# Patient Record
Sex: Female | Born: 1945 | Race: Black or African American | Hispanic: No | Marital: Single | Smoking: Never smoker
Health system: Southern US, Community
[De-identification: ages and names within clinical notes are randomized; demographics above are authoritative.]

## PROBLEM LIST (undated history)

## (undated) DIAGNOSIS — E119 Type 2 diabetes mellitus without complications: Secondary | ICD-10-CM

## (undated) DIAGNOSIS — I10 Essential (primary) hypertension: Secondary | ICD-10-CM

## (undated) HISTORY — PX: CHOLECYSTECTOMY: SHX55

---

## 1998-04-27 ENCOUNTER — Other Ambulatory Visit: Admission: RE | Admit: 1998-04-27 | Discharge: 1998-04-27 | Payer: Self-pay | Admitting: Obstetrics and Gynecology

## 1998-04-27 ENCOUNTER — Other Ambulatory Visit: Admission: RE | Admit: 1998-04-27 | Discharge: 1998-04-27 | Payer: Self-pay | Admitting: *Deleted

## 1998-06-29 ENCOUNTER — Ambulatory Visit (HOSPITAL_COMMUNITY): Admission: RE | Admit: 1998-06-29 | Discharge: 1998-06-29 | Payer: Self-pay | Admitting: Gastroenterology

## 2000-04-19 ENCOUNTER — Emergency Department (HOSPITAL_COMMUNITY): Admission: EM | Admit: 2000-04-19 | Discharge: 2000-04-19 | Payer: Self-pay | Admitting: Emergency Medicine

## 2000-05-17 ENCOUNTER — Emergency Department (HOSPITAL_COMMUNITY): Admission: EM | Admit: 2000-05-17 | Discharge: 2000-05-17 | Payer: Self-pay | Admitting: Emergency Medicine

## 2000-08-29 ENCOUNTER — Encounter: Payer: Self-pay | Admitting: Family Medicine

## 2000-08-29 ENCOUNTER — Ambulatory Visit (HOSPITAL_COMMUNITY): Admission: RE | Admit: 2000-08-29 | Discharge: 2000-08-29 | Payer: Self-pay | Admitting: Family Medicine

## 2000-11-04 ENCOUNTER — Other Ambulatory Visit: Admission: RE | Admit: 2000-11-04 | Discharge: 2000-11-04 | Payer: Self-pay | Admitting: *Deleted

## 2000-12-19 ENCOUNTER — Encounter (INDEPENDENT_AMBULATORY_CARE_PROVIDER_SITE_OTHER): Payer: Self-pay | Admitting: *Deleted

## 2000-12-19 ENCOUNTER — Ambulatory Visit (HOSPITAL_COMMUNITY): Admission: RE | Admit: 2000-12-19 | Discharge: 2000-12-19 | Payer: Self-pay | Admitting: *Deleted

## 2001-11-11 ENCOUNTER — Other Ambulatory Visit: Admission: RE | Admit: 2001-11-11 | Discharge: 2001-11-11 | Payer: Self-pay | Admitting: Obstetrics & Gynecology

## 2001-11-18 ENCOUNTER — Ambulatory Visit (HOSPITAL_COMMUNITY): Admission: RE | Admit: 2001-11-18 | Discharge: 2001-11-18 | Payer: Self-pay | Admitting: Family Medicine

## 2001-11-18 ENCOUNTER — Encounter: Payer: Self-pay | Admitting: Family Medicine

## 2002-01-12 ENCOUNTER — Encounter: Admission: RE | Admit: 2002-01-12 | Discharge: 2002-01-12 | Payer: Self-pay | Admitting: Family Medicine

## 2002-01-12 ENCOUNTER — Encounter: Payer: Self-pay | Admitting: Family Medicine

## 2002-11-13 ENCOUNTER — Other Ambulatory Visit: Admission: RE | Admit: 2002-11-13 | Discharge: 2002-11-13 | Payer: Self-pay | Admitting: Obstetrics & Gynecology

## 2002-11-20 ENCOUNTER — Ambulatory Visit (HOSPITAL_COMMUNITY): Admission: RE | Admit: 2002-11-20 | Discharge: 2002-11-20 | Payer: Self-pay | Admitting: Family Medicine

## 2002-11-20 ENCOUNTER — Encounter: Payer: Self-pay | Admitting: Family Medicine

## 2003-09-13 ENCOUNTER — Emergency Department (HOSPITAL_COMMUNITY): Admission: EM | Admit: 2003-09-13 | Discharge: 2003-09-13 | Payer: Self-pay | Admitting: *Deleted

## 2003-11-04 ENCOUNTER — Encounter (INDEPENDENT_AMBULATORY_CARE_PROVIDER_SITE_OTHER): Payer: Self-pay | Admitting: Specialist

## 2003-11-04 ENCOUNTER — Ambulatory Visit (HOSPITAL_COMMUNITY): Admission: RE | Admit: 2003-11-04 | Discharge: 2003-11-05 | Payer: Self-pay | Admitting: General Surgery

## 2003-11-23 ENCOUNTER — Ambulatory Visit (HOSPITAL_COMMUNITY): Admission: RE | Admit: 2003-11-23 | Discharge: 2003-11-23 | Payer: Self-pay | Admitting: Family Medicine

## 2004-12-06 ENCOUNTER — Ambulatory Visit (HOSPITAL_COMMUNITY): Admission: RE | Admit: 2004-12-06 | Discharge: 2004-12-06 | Payer: Self-pay | Admitting: Family Medicine

## 2008-05-17 ENCOUNTER — Emergency Department (HOSPITAL_COMMUNITY): Admission: EM | Admit: 2008-05-17 | Discharge: 2008-05-17 | Payer: Self-pay | Admitting: Emergency Medicine

## 2009-12-13 ENCOUNTER — Encounter: Admission: RE | Admit: 2009-12-13 | Discharge: 2009-12-13 | Payer: Self-pay | Admitting: Family Medicine

## 2010-05-17 LAB — POCT I-STAT, CHEM 8
BUN: 8 mg/dL (ref 6–23)
Calcium, Ion: 1.14 mmol/L (ref 1.12–1.32)
Chloride: 104 mEq/L (ref 96–112)
Creatinine, Ser: 0.7 mg/dL (ref 0.4–1.2)
Glucose, Bld: 125 mg/dL — ABNORMAL HIGH (ref 70–99)
HCT: 44 % (ref 36.0–46.0)
Hemoglobin: 15 g/dL (ref 12.0–15.0)
Potassium: 3.5 mEq/L (ref 3.5–5.1)
Sodium: 139 mEq/L (ref 135–145)
TCO2: 26 mmol/L (ref 0–100)

## 2010-05-17 LAB — URINE MICROSCOPIC-ADD ON

## 2010-05-17 LAB — URINALYSIS, ROUTINE W REFLEX MICROSCOPIC
Bilirubin Urine: NEGATIVE
Glucose, UA: NEGATIVE mg/dL
Ketones, ur: NEGATIVE mg/dL
Leukocytes, UA: NEGATIVE
Nitrite: NEGATIVE
Protein, ur: 100 mg/dL — AB
Specific Gravity, Urine: 1.024 (ref 1.005–1.030)
Urobilinogen, UA: 0.2 mg/dL (ref 0.0–1.0)
pH: 6 (ref 5.0–8.0)

## 2010-05-17 LAB — CBC
HCT: 39.9 % (ref 36.0–46.0)
Hemoglobin: 13.3 g/dL (ref 12.0–15.0)
MCHC: 33.3 g/dL (ref 30.0–36.0)
MCV: 83.9 fL (ref 78.0–100.0)
Platelets: 198 10*3/uL (ref 150–400)
RBC: 4.75 MIL/uL (ref 3.87–5.11)
RDW: 14.6 % (ref 11.5–15.5)
WBC: 5.8 10*3/uL (ref 4.0–10.5)

## 2010-05-17 LAB — DIFFERENTIAL
Basophils Absolute: 0 10*3/uL (ref 0.0–0.1)
Basophils Relative: 0 % (ref 0–1)
Eosinophils Absolute: 0.1 10*3/uL (ref 0.0–0.7)
Eosinophils Relative: 1 % (ref 0–5)
Lymphocytes Relative: 23 % (ref 12–46)
Lymphs Abs: 1.3 10*3/uL (ref 0.7–4.0)
Monocytes Absolute: 0.3 10*3/uL (ref 0.1–1.0)
Monocytes Relative: 6 % (ref 3–12)
Neutro Abs: 4.1 10*3/uL (ref 1.7–7.7)
Neutrophils Relative %: 70 % (ref 43–77)

## 2010-06-23 NOTE — H&P (Signed)
[facility]  Patient:    Terri Mcconnell, Terri Mcconnell Visit Number: [id number] MRN: [id number]          Service Type: Attending:  Sung Amabile. Roslyn Smiling, M.D. Dictated by:   Sung Amabile Roslyn Smiling, M.D. Adm. Date:  12/19/00                           History and Physical  DATE OF BIRTH:                Jun 05, 1961  HISTORY OF PRESENT ILLNESS:   [age]-year-old woman with a history of irregular bleeding.  She is believed to be postmenopausal, but FSH is unavailable.  She has had an IUD in place for more than 30 years, and she is admitted now for removal of IUD and D&C.  Her history is also significant for Trichomonas which was treated in 2001, and again this year.  Recent wet prep is negative.  She has no abdominal pain. She has been counseled regarding the benefits, risks, and options as well as the suspected outcome of this procedure prior to surgery.  PAST MEDICAL HISTORY:         Hypertension.  PAST SURGICAL HISTORY:        None.  OBSTETRICAL HISTORY:          Vaginal deliveries in 1966, 1967, 1968.  ALLERGIES:                    None.  MEDICATIONS:                  1. Toprol XL 50 mg q.d.                               2. Hyzaar 10/25.                               3. Altace 10 mg daily.  FAMILY HISTORY:               Father died of CVA.  Mother with hypertension and cardiovascular disease.  Sister died of breast cancer, age 87.  Brother with adult-onset diabetes mellitus.  SOCIAL HISTORY:               Married.  Works at [employer].  Denies tobacco use.  Drinks two times monthly.  PHYSICAL EXAMINATION:  GENERAL:                      Moderately obese woman.  VITAL SIGNS:                  Recent blood pressure 114/82.  Weight 200.8 pounds.  Vital signs stable.  HEENT:                        Within normal limits.  NECK:                         Without thyromegaly.  CHEST:                        Clear.  HEART:                        Regular rate and  rhythm.  S1, S2, normal.  BREASTS:                      Without mass, discharge, axillary or supraclavicular nodes.  ABDOMEN:                      Soft and nontender.  Without organomegaly, mass, or hernia.  GENITOURINARY:                External genitalia, BUS, vagina without lesion or discharge.  Cervix without lesion.  Uterus retroverted, 10-week size, nontender, mobile.  Adnexa nonpalpable.  RECTOVAGINAL:                 Confirmatory.  EXTREMITIES:                  Without CCE.  NEUROLOGIC:                   Grossly intact.  SKIN:                         Without lesions.  LABORATORY DATA:              Mammogram normal August 29, 2000, at [facility] [place].  Ultrasound November 22, 2000, [facility], shows multiple fibroids with prominent fundal fibroid measuring 8.1 x 4.7 x 4.2. Endometrium measured 0.9 cm.  IUD appears to be in the uterine cavity.  A small simple left ovarian cyst measuring 1.8 x 2.2 x 2.0 was also noted.  Pap smear November 04, 2000, within normal limits except for Trichomonas (treated).  ASSESSMENT:                   1. Postmenopausal bleeding, rule out endometrial                                  pathology.                               2. Retained intrauterine device, recommend                                  removal to allow for further evaluation of                                  endometrium and to eliminate the variable of                                  the intrauterine device with respect to                                  bleeding.                               3. Hypertension, controlled with medication.                               4. Obesity.  5. History of Trichomonas, treated.  PLAN:                         Operative hysteroscopy, D&C, and IUD removal. The patient has been counseled regarding the benefits, risks, and options, as well as the expected outcome of this procedure.  Questions  have been answered preoperatively.  Risks include hemorrhage, infection, uterine perforation with need for laparoscopy to evaluate possibility of intra-abdominal organ damage (bowel, bladder, reproductive organs, blood vessels, nerves, ureters).  The patient has viewed the hysteroscopy videotape and has been given materials regarding this procedure. Dictated by:   Sung Amabile Roslyn Smiling, M.D. Attending:  Sung Amabile. Roslyn Smiling, M.D. DD:  12/15/00 TD:  12/15/00 Job: [id number]

## 2010-06-23 NOTE — Op Note (Signed)
NAME:  Terri Mcconnell, Terri Mcconnell NO.:  [id number]   MEDICAL RECORD NO.:  [id number]          PATIENT TYPE:  OIB   LOCATION:  5733                         FACILITY:  [facility]   PHYSICIAN:  Leonie Man, M.D.   DATE OF BIRTH:  August 12, 1945   DATE OF PROCEDURE:  11/04/2003  DATE OF DISCHARGE:                                 OPERATIVE REPORT   PREOPERATIVE DIAGNOSIS:  Chronic calculous cholecystitis.   POSTOPERATIVE DIAGNOSIS:  Chronic calculous cholecystitis.   PROCEDURE:  Laparoscopic cholecystectomy with intraoperative cholangiogram.   SURGEON:  Leonie Man, M.D.   ASSISTANT:  Anselm Pancoast. Zachery Dakins, M.D.   ANESTHESIA:  General.   The patient is a 65 year old lady referred courtesy of Renaye Rakers, M.D.,  with recurrent attacks of upper abdominal pain associated with nausea and  vomiting.  She had a CT scan of the abdomen, which demonstrates  cholelithiasis.  Liver function studies are within normal limits.   She comes to the operating room now after the risks and potential benefits  of surgery have been fully discussed, all questions answered, and consent  obtained.   PROCEDURE:  Following the induction of satisfactory general anesthesia, the  patient is positioned supinely.  The abdomen is routinely prepped and draped  to be included in the sterile operative field.  Open laparoscopy is created  at the umbilicus with insertion of a Hasson cannula and insufflation of the  peritoneal cavity to 14 mmHg  pressure.  The camera is inserted, visual  inspection of the abdomen carried out.  The liver was smooth, liver edges  sharp.  The gallbladder was chronically scarred and packed with stones.  Anterior gastric wall and duodenal sweep appeared to be normal, and none of  the small or large intestine viewed appeared to be abnormal.   Under direct vision, epigastric and lateral ports were placed.  The  gallbladder was grasped at its fundus and retracted cephalad, dissection  then carried down in the region of the ampulla at the hepatoduodenal  ligament.  I mobilized the cystic artery and cystic duct by tracing the  cystic duct up to its cystic duct-gallbladder junction and the cystic artery  onto its entry into the gallbladder wall.  The cystic artery was triply  clipped and transected.  The cystic duct was clipped proximally and opened.  A cystic duct cholangiogram was then carried out by passing a Cook catheter  into the abdomen and inserting it into the cystic duct.  The duct was then  injected with one-half strength Hypaque dye with the resulting cholangiogram  showing rapid flow of contrast into the duodenum and up into the upper  hepatic radicles with no evidence of any filling defects consistent with  stones.  The cholangiocatheter was removed and the cystic duct was triply  clipped and then transected.  The gallbladder was then dissected free from  the liver bed using electrocautery and maintaining hemostasis throughout the  entire course of the dissection.  At the end of the dissection, the liver  bed was checked for hemostasis and noted to be dry.  All the areas of  dissection were noted to be dry.  The camera was then moved to the  epigastric port and the gallbladder was retrieved through the umbilical port  without difficulty.  The trocars were then removed under direct vision and  pneumoperitoneum allowed to deflate.  Sponge, instrument, and sharp counts  verified and the wounds closed in layers as follows:  The umbilical  wound in two layers with 0 Vicryl and 4-0 Monocryl, epigastric and lateral  flank wounds were closed with 4-0 Monocryl sutures.  All wounds were then  covered with Dermabond.  The anesthetic was reversed, patient removed from  the operating room to the recovery room in stable condition.  She tolerated  the procedure well.      Patr   PB/MEDQ  D:  11/04/2003  T:  11/04/2003  Job:  [id number]   cc:   Renaye Rakers, M.D.  [address]  [address], [address]  [place]  [place] [address]  Fax: [phone]

## 2010-06-23 NOTE — Op Note (Signed)
[facility]  Patient:    Terri Mcconnell, Terri Mcconnell Visit Number: [id number] MRN: [id number]          Service Type: DSU Location: [facility] Attending Physician:  Ardeen Fillers Dictated by:   Sung Amabile. Roslyn Smiling, M.D. Proc. Date: 12/19/00 Admit Date:  12/19/2000                             Operative Report  INDICATIONS:                  This 65 year old woman, postmenopausal by [facility], with a recent history of irregular bleeding.  She has had a Lippes loop in place for more than 30 years, and it is retained.  The string is not visualized.  She is admitted now for the removal of the IUD, and an endometrial evaluation, to rule out endometrial pathology.  PREOPERATIVE DIAGNOSES:       1. Postmenopausal bleeding.                               2. Retained intrauterine device.   POSTOPERATIVE DIAGNOSES:      1. Postmenopausal bleeding.                               2. Retained intrauterine device.  PROCEDURE:                    1. Operative hysteroscopy.                               2. Dilatation and curettage.                               3. Removal of retained intrauterine device.  SURGEON:                      Sung Amabile. Roslyn Smiling, M.D.  ANESTHESIA:                   General anesthesia, endotracheal tube                               intubation, and paracervical block.  ESTIMATED BLOOD LOSS:         100 cc.  TUBES AND DRAINS:             None.  COMPLICATIONS:                None.  FINDINGS;                     A retroverted uterus sounded to 12 cm.  Shaggy endometrium near the fundus, excised.  Retained, not embedded, IUD removed.  SPECIMENS:                    Hysteroscopic endometrial biopsies and endometrial curettings to pathology.  DESCRIPTION OF PROCEDURE:     After the establishment of general anesthesia, the patient was placed in the dorsal lithotomy position.  The perineum and vagina were prepped and the bladder was evacuated with  straight catheterization.  The patient was draped.  Examination under anesthesia for the above  findings was carried out.  A Graves speculum was inserted in the vagina.  The cervix was reprepped with Betadine solution.  The anterior cervical lip was infiltrated with 1% Nesacaine and then grasped with the single tooth tenaculum.  A paracervical block was placed in the usual fashion using 20 cc of 1% Nesacaine.  The uterus was noted to be retroverted and sounded to 12 cm.  Pratt dilators were used to serially dilate the endocervical canal to a 35-French.  The operative hysteroscope was introduced into the endometrial cavity easily.  The IUD was seen and photographs were taken.  The scope was removed and the IUD was retrieved using small ring forceps.  The scope was reinserted and further photographs were taken.  Shaggy areas near the fundus were biopsied.  The scope was removed for the last time, and a sharp curettage was performed.  The instruments were removed and  hemostasis was noted.  The patient was returned to the supine position, extubated without difficulty, and transported to the recovery room in satisfactory condition.  Sorbitol was the distending medium during this case.  There is a deficit of 10 cc at the end of the case.  A single loop 90-degree resectoscopic loop was used with settings of 90 and 60, cutting and coagulation respectively. Dictated by:   Sung Amabile Roslyn Smiling, M.D. Attending Physician:  Ardeen Fillers DD:  12/19/00 TD:  12/19/00 Job: [id number]

## 2010-10-22 ENCOUNTER — Inpatient Hospital Stay (HOSPITAL_COMMUNITY)
Admission: EM | Admit: 2010-10-22 | Discharge: 2010-10-23 | DRG: 312 | Disposition: A | Discharge status: Home / Self Care | Payer: PRIVATE HEALTH INSURANCE | Attending: Family Medicine | Admitting: Family Medicine

## 2010-10-22 ENCOUNTER — Emergency Department (HOSPITAL_COMMUNITY): Payer: PRIVATE HEALTH INSURANCE

## 2010-10-22 DIAGNOSIS — I951 Orthostatic hypotension: Principal | ICD-10-CM | POA: Diagnosis present

## 2010-10-22 DIAGNOSIS — Y92009 Unspecified place in unspecified non-institutional (private) residence as the place of occurrence of the external cause: Secondary | ICD-10-CM

## 2010-10-22 DIAGNOSIS — I6529 Occlusion and stenosis of unspecified carotid artery: Secondary | ICD-10-CM | POA: Diagnosis present

## 2010-10-22 DIAGNOSIS — E7800 Pure hypercholesterolemia, unspecified: Secondary | ICD-10-CM | POA: Diagnosis present

## 2010-10-22 DIAGNOSIS — Z7982 Long term (current) use of aspirin: Secondary | ICD-10-CM

## 2010-10-22 DIAGNOSIS — E876 Hypokalemia: Secondary | ICD-10-CM | POA: Diagnosis present

## 2010-10-22 DIAGNOSIS — Z79899 Other long term (current) drug therapy: Secondary | ICD-10-CM

## 2010-10-22 DIAGNOSIS — N179 Acute kidney failure, unspecified: Secondary | ICD-10-CM | POA: Diagnosis present

## 2010-10-22 DIAGNOSIS — F329 Major depressive disorder, single episode, unspecified: Secondary | ICD-10-CM | POA: Diagnosis present

## 2010-10-22 DIAGNOSIS — T502X5A Adverse effect of carbonic-anhydrase inhibitors, benzothiadiazides and other diuretics, initial encounter: Secondary | ICD-10-CM | POA: Diagnosis present

## 2010-10-22 DIAGNOSIS — E785 Hyperlipidemia, unspecified: Secondary | ICD-10-CM | POA: Diagnosis present

## 2010-10-22 DIAGNOSIS — J309 Allergic rhinitis, unspecified: Secondary | ICD-10-CM | POA: Diagnosis present

## 2010-10-22 DIAGNOSIS — E119 Type 2 diabetes mellitus without complications: Secondary | ICD-10-CM | POA: Diagnosis present

## 2010-10-22 DIAGNOSIS — I10 Essential (primary) hypertension: Secondary | ICD-10-CM | POA: Diagnosis present

## 2010-10-22 DIAGNOSIS — F3289 Other specified depressive episodes: Secondary | ICD-10-CM | POA: Diagnosis present

## 2010-10-22 LAB — URINE MICROSCOPIC-ADD ON

## 2010-10-22 LAB — URINALYSIS, ROUTINE W REFLEX MICROSCOPIC
Bilirubin Urine: NEGATIVE
Glucose, UA: NEGATIVE mg/dL
Ketones, ur: NEGATIVE mg/dL
Nitrite: NEGATIVE
Protein, ur: NEGATIVE mg/dL
Specific Gravity, Urine: 1.017 (ref 1.005–1.030)
Urobilinogen, UA: 0.2 mg/dL (ref 0.0–1.0)
pH: 5 (ref 5.0–8.0)

## 2010-10-22 LAB — POCT I-STAT, CHEM 8
BUN: 26 mg/dL — ABNORMAL HIGH (ref 6–23)
Calcium, Ion: 1.19 mmol/L (ref 1.12–1.32)
Chloride: 103 mEq/L (ref 96–112)
Creatinine, Ser: 1.4 mg/dL — ABNORMAL HIGH (ref 0.50–1.10)
Glucose, Bld: 135 mg/dL — ABNORMAL HIGH (ref 70–99)
HCT: 42 % (ref 36.0–46.0)
Hemoglobin: 14.3 g/dL (ref 12.0–15.0)
Potassium: 2.9 mEq/L — ABNORMAL LOW (ref 3.5–5.1)
Sodium: 139 mEq/L (ref 135–145)
TCO2: 21 mmol/L (ref 0–100)

## 2010-10-22 LAB — CBC
HCT: 35.7 % — ABNORMAL LOW (ref 36.0–46.0)
Hemoglobin: 11.9 g/dL — ABNORMAL LOW (ref 12.0–15.0)
MCH: 27.7 pg (ref 26.0–34.0)
MCHC: 33.3 g/dL (ref 30.0–36.0)
MCV: 83.2 fL (ref 78.0–100.0)
Platelets: 208 10*3/uL (ref 150–400)
RBC: 4.29 MIL/uL (ref 3.87–5.11)
RDW: 14.9 % (ref 11.5–15.5)
WBC: 6.1 10*3/uL (ref 4.0–10.5)

## 2010-10-22 LAB — GLUCOSE, CAPILLARY: Glucose-Capillary: 150 mg/dL — ABNORMAL HIGH (ref 70–99)

## 2010-10-22 LAB — POCT I-STAT TROPONIN I: Troponin i, poc: 0 ng/mL (ref 0.00–0.08)

## 2010-10-23 DIAGNOSIS — R55 Syncope and collapse: Secondary | ICD-10-CM

## 2010-10-23 LAB — CARDIAC PANEL(CRET KIN+CKTOT+MB+TROPI)
CK, MB: 3.6 ng/mL (ref 0.3–4.0)
CK, MB: 3.7 ng/mL (ref 0.3–4.0)
CK, MB: 3.2 ng/mL (ref 0.3–4.0)
Relative Index: 1.7 (ref 0.0–2.5)
Relative Index: 1.5 (ref 0.0–2.5)
Relative Index: 1.3 (ref 0.0–2.5)
Total CK: 209 U/L — ABNORMAL HIGH (ref 7–177)
Total CK: 246 U/L — ABNORMAL HIGH (ref 7–177)
Total CK: 243 U/L — ABNORMAL HIGH (ref 7–177)
Troponin I: <0.3 ng/mL (ref <0.30)
Troponin I: <0.3 ng/mL (ref <0.30)
Troponin I: <0.3 ng/mL (ref <0.30)

## 2010-10-23 LAB — CBC
HCT: 30.2 % — ABNORMAL LOW (ref 36.0–46.0)
Hemoglobin: 9.9 g/dL — ABNORMAL LOW (ref 12.0–15.0)
MCH: 27 pg (ref 26.0–34.0)
MCHC: 32.8 g/dL (ref 30.0–36.0)
MCV: 82.3 fL (ref 78.0–100.0)
Platelets: 200 10*3/uL (ref 150–400)
RBC: 3.67 MIL/uL — ABNORMAL LOW (ref 3.87–5.11)
RDW: 15 % (ref 11.5–15.5)
WBC: 5.8 10*3/uL (ref 4.0–10.5)

## 2010-10-23 LAB — COMPREHENSIVE METABOLIC PANEL
ALT: 17 U/L (ref 0–35)
AST: 19 U/L (ref 0–37)
Albumin: 3.5 g/dL (ref 3.5–5.2)
Alkaline Phosphatase: 69 U/L (ref 39–117)
BUN: 24 mg/dL — ABNORMAL HIGH (ref 6–23)
CO2: 23 mEq/L (ref 19–32)
Calcium: 8.5 mg/dL (ref 8.4–10.5)
Chloride: 102 mEq/L (ref 96–112)
Creatinine, Ser: 0.75 mg/dL (ref 0.50–1.10)
GFR calc Af Amer: >60 mL/min (ref >60)
GFR calc non Af Amer: >60 mL/min (ref >60)
Glucose, Bld: 99 mg/dL (ref 70–99)
Potassium: 3.6 mEq/L (ref 3.5–5.1)
Sodium: 138 mEq/L (ref 135–145)
Total Bilirubin: 0.2 mg/dL — ABNORMAL LOW (ref 0.3–1.2)
Total Protein: 6.7 g/dL (ref 6.0–8.3)

## 2010-10-23 LAB — CK TOTAL AND CKMB (NOT AT ARMC)
CK, MB: 4 ng/mL (ref 0.3–4.0)
Relative Index: 1.8 (ref 0.0–2.5)
Total CK: 223 U/L — ABNORMAL HIGH (ref 7–177)

## 2010-10-23 LAB — GLUCOSE, CAPILLARY
Glucose-Capillary: 110 mg/dL — ABNORMAL HIGH (ref 70–99)
Glucose-Capillary: 139 mg/dL — ABNORMAL HIGH (ref 70–99)
Glucose-Capillary: 114 mg/dL — ABNORMAL HIGH (ref 70–99)

## 2010-10-23 LAB — TSH: TSH: 2.048 u[IU]/mL (ref 0.350–4.500)

## 2010-10-23 LAB — LIPID PANEL
Cholesterol: 151 mg/dL (ref 0–200)
HDL: 71 mg/dL (ref >39)
LDL Cholesterol: 58 mg/dL (ref 0–99)
Total CHOL/HDL Ratio: 2.1 RATIO
Triglycerides: 110 mg/dL (ref <150)
VLDL: 22 mg/dL (ref 0–40)

## 2010-10-23 LAB — PRO B NATRIURETIC PEPTIDE: Pro B Natriuretic peptide (BNP): 15.2 pg/mL (ref 0–125)

## 2010-10-23 LAB — PHOSPHORUS: Phosphorus: 4.6 mg/dL (ref 2.3–4.6)

## 2010-10-23 LAB — TROPONIN I: Troponin I: <0.3 ng/mL (ref <0.30)

## 2010-10-23 LAB — HEMOGLOBIN A1C
Hgb A1c MFr Bld: 6.3 % — ABNORMAL HIGH (ref <5.7)
Mean Plasma Glucose: 134 mg/dL — ABNORMAL HIGH (ref <117)

## 2010-10-23 LAB — MAGNESIUM: Magnesium: 1.9 mg/dL (ref 1.5–2.5)

## 2010-10-26 ENCOUNTER — Encounter (INDEPENDENT_AMBULATORY_CARE_PROVIDER_SITE_OTHER): Payer: PRIVATE HEALTH INSURANCE

## 2010-10-26 DIAGNOSIS — R55 Syncope and collapse: Secondary | ICD-10-CM

## 2010-11-14 NOTE — Discharge Summary (Signed)
Terri Mcconnell, Terri Mcconnell NO.:  [id number]  MEDICAL RECORD NO.:  [id number]  LOCATION:  4735                         FACILITY:  [facility]  PHYSICIAN:  Mauro Kaufmann, MD         DATE OF BIRTH:  May 11, 1945  DATE OF ADMISSION:  10/22/2010 DATE OF DISCHARGE:  10/23/2010                              DISCHARGE SUMMARY   ADMISSION DIAGNOSES: 1. Syncope. 2. Hypokalemia. 3. Orthostatic hypotension. 4. Diabetes mellitus.  DISCHARGE DIAGNOSES: 1. Syncope, resolved. 2. Orthostatic hypotension due to the hydrochlorothiazide. 3. Hypokalemia, resolved secondary to diuretic. 4. Orthostatic hypotension, resolved. 5. Diabetes mellitus.  TESTS PERFORMED IN THE HOSPITAL: 1. Chest x-ray on October 22, 2010, showed no acute disease.  CT of     the head without contrast showed no evidence of acute abnormality.     The patient had a 2-D echocardiogram done on October 23, 2010     which showed EF of 55%-65% grade 2 diastolic dysfunction. 2. Carotid artery Duplex scan showed right 40%-59% ICA stenosis mid     high range of the scale.  BRIEF HISTORY AND PHYSICAL:  A 65 year old female with history of diabetes, hyperlipidemia doing fine, actually she had just finished cooking dinner, went to bathroom and passed out and again she only passed out for 30 seconds and came to bed and passed out again.  The patient was found in the ER to be orthostatic with the blood pressure dropping from 190 to 99.  Her diuretics were held, she also found to have hypokalemia which was also considered secondary to diuretic use. She was found to be in acute renal failure due to the hydrochlorothiazide use.  She was started on IV fluids.  BRIEF HOSPITAL COURSE: 1. Syncope most likely thought to be secondary to orthostatic     hypotension, has been resolved.  Renal function has improved.  BUN     of 24 and creatinine 0.75.  The patient's blood pressure has also     improved.  At this time, her blood  pressure is 137/76, respirations     16, pulse 100, and orthostatics have been negative now.  The     patient's EKG shows first degree AV block and some ST-T changes.     Cardiac enzymes x3 negative.  We are going to get Cardiology to set     up a Holter monitor for the patient as an outpatient.  Also the     patient had bilateral carotid artery Duplex which shows right ICA     stenosis of 40%-69%.  At this time, the patient is tipped for     surgery.  I discussed with Neurology.  The patient will be sent     home on aspirin for primary prevention of stroke because the     patient is at high risk for stroke, including hypertension and     diabetes mellitus. 2. Hypertension.  The patient will be sent home on Exforge.  I am     going to stop the hydrochlorothiazide.  So, she will be only on     amlodipine and losartan, I am going to cut down the losartan from  320 to 160 mg.  So, the patient will take this dose of medication     and will check with the primary care physician Dr. Renaye Rakers in 1     week.  MEDICATIONS ON DISCHARGE: 1. Aspirin 81 mg p.o. daily. 2. Exforge 10/160 one tablet p.o. daily. 3. Cod liver oil one capsule p.o. daily. 4. Crestor 10 mg p.o. daily. 5. Metformin 500 mg p.o. daily at bedtime.  Again, we will set up the Holter monitor as an outpatient.  The patient is totally asymptomatic at this time.     Mauro Kaufmann, MD     GL/MEDQ  D:  10/23/2010  T:  10/23/2010  Job:  [id number]  cc:   Renaye Rakers, M.D.  Electronically Signed by Mauro Kaufmann  on 11/14/2010 09:34:05 AM

## 2010-11-18 NOTE — H&P (Signed)
NAMEMarland Mcconnell  [name], RUBERG NO.:  [id number]  MEDICAL RECORD NO.:  [id number]  LOCATION:  [facility]                         FACILITY:  [facility]  PHYSICIAN:  Lonia Blood, M.D.      DATE OF BIRTH:  [birth year]  DATE OF ADMISSION:  10/22/2010 DATE OF DISCHARGE:                             HISTORY & PHYSICAL   PRIMARY CARE PHYSICIAN:  Renaye Rakers, MD  PRESENTING COMPLAINT:  Syncope.  HISTORY OF PRESENT ILLNESS:  The patient is 65 year old female with history of diabetes and hyperlipidemia who was doing fine, actually had just finished cooking dinner, went to the bathroom, and passed out.  She apparently passed out for about 30 seconds, came to get up and passed out again and according to EMS, was found in the bathtub.  She has never had any prior syncopal episodes.  No history of seizure disorder.  She did not hit her head anywhere.  Denied any palpitations.  No chest pain. No shortness of breath, but she felt diaphoretic.  In the ED, the patient was also found to be orthostatic somewhat.  She continues to have nausea and vomiting after the episode including en route to the ED according to EMS.  All this started after her initial symptoms.  No fever or chills.  No recent cold, nausea, vomiting, or diarrhea.  PAST MEDICAL HISTORY:  Significant for: 1. Diabetes. 2. Hypertension. 3. Hyperlipidemia. 4. Allergic rhinitis. 5. Depression.  ALLERGIES:  NO KNOWN DRUG ALLERGIES.  MEDICATIONS:  Crestor, Exforge HCT, metformin, and Paxil.  SOCIAL HISTORY:  She is married, lives in [place] with her husband. No tobacco, occasional alcohol.  No IV drug history.  FAMILY HISTORY:  Denied any family history of heart disease or sudden death.  REVIEW OF SYSTEMS:  All systems reviewed and are negative except per HPI.  PHYSICAL EXAMINATION:  VITAL SIGNS:  Temperature is 97.5.  Initial blood pressure 110/63 with a pulse of 94, respiratory rate of 22, her sats 97% on room air.  The  patient was orthostatic from lying-to-standing with her pulse going from 73-98, blood pressure dropping from 119 to 99, that is systolic. GENERAL:  She is currently awake, alert, oriented, sitting in the chair, no dizziness, in no acute distress. HEENT:  PERRL.  EOMI.  No significant pallor, no jaundice.  No rhinorrhea. NECK:  Supple.  No visible JVD, no lymphadenopathy. RESPIRATORY:  She has good air entry bilaterally.  No wheezes, no rales, no crackles. CARDIOVASCULAR:  The patient has S1-S2.  No audible murmur. ABDOMEN: Soft, full, nontender with positive bowel sounds. EXTREMITIES:  No edema, cyanosis, or clubbing. SKIN:  No rashes.  No ulcers.  LABORATORY DATA:  Sodium 139, potassium 2.9, chloride 103, BUN 26, creatinine 1.40, glucose 135, calcium 1.19.  Troponin is negative. White count 6.1, hemoglobin 11.9 with platelet count 208.  Her chest x- ray showed no active disease.  Head CT without contrast showed no acute intracranial abnormality.  Her EKG showed normal sinus rhythm with prolonged PR interval of 208 indicating first-degree block and poor R- wave progression, but no significant ST changes.  EKG is not significantly changed from her prior EKG of November 03, 2003.  ASSESSMENT:  This is a 65 year old female admitted with syncopal episode, more than likely secondary to dehydration.  She is taking diuretic and per patient her last meal was this morning.  She has some mild renal insufficiency, but it is not clear if this is acute or chronic.  We have no old creatinine right now on record to check.  The patient also has had a syncopal episode because of transient arrhythmias, although she denied any palpitations, or similar symptoms. CVA is less likely.  She has no focal weakness.  Her neuro exam is completely normal.  PLAN: 1. Syncope.  Admit the patient, hydrate the patient, check     orthostatics in the morning.  I will check 2-D echo and carotid     Dopplers.  I  will defer MRI, MRA, I do not think the patient will     need that now unless further abnormalities are found.  I will hold     her diuretics for now. 2. Hypokalemia.  Probably from her nausea and vomiting.  I will     replete the potassium and check an magnesium level.  3.  Nausea and     vomiting.  More than likely from associated with her syncopal     episode.  She is now feeling GERD like symptoms with epigastric     discomfort.  I will put her on PPIs. 3. Orthostatic hypotension.  Again more than likely is related to     diuretic use. 4. Diabetes.  I will put her on sliding scale insulin and hold the     metformin while in the hospital.  I will check a hemoglobin A1c as     well.  Further treatment will depend on the patient's response to these measures.     Lonia Blood, M.D.     Verlin Grills  D:  10/22/2010  T:  10/22/2010  Job:  [id number]  Electronically Signed by Lonia Blood M.D. on 11/18/2010 03:13:57 PM

## 2012-07-09 ENCOUNTER — Other Ambulatory Visit (HOSPITAL_COMMUNITY): Payer: Self-pay | Admitting: Family Medicine

## 2012-07-09 DIAGNOSIS — Z1231 Encounter for screening mammogram for malignant neoplasm of breast: Secondary | ICD-10-CM

## 2012-07-14 ENCOUNTER — Ambulatory Visit (HOSPITAL_COMMUNITY)
Admission: RE | Admit: 2012-07-14 | Discharge: 2012-07-14 | Disposition: A | Discharge status: Home / Self Care | Payer: Medicare PPO | Source: Ambulatory Visit | Attending: Family Medicine | Admitting: Family Medicine

## 2012-07-14 DIAGNOSIS — Z1231 Encounter for screening mammogram for malignant neoplasm of breast: Secondary | ICD-10-CM | POA: Insufficient documentation

## 2012-11-27 IMAGING — CT CT HEAD W/O CM
1 of 2 series · 13 of 30 positions shown, 17 images · non-contrast
Comparison: 05/17/2008

CLINICAL DATA: Syncope.

CT HEAD WITHOUT CONTRAST
TECHNIQUE: Contiguous axial images were obtained from the base of
the skull through the vertex without contrast.

[Series 2: brain · axial · 0.47mm/px · z∈[+86,+208]mm · 13 of 28 slices shown, 17 images]
[im 2/28  brain]
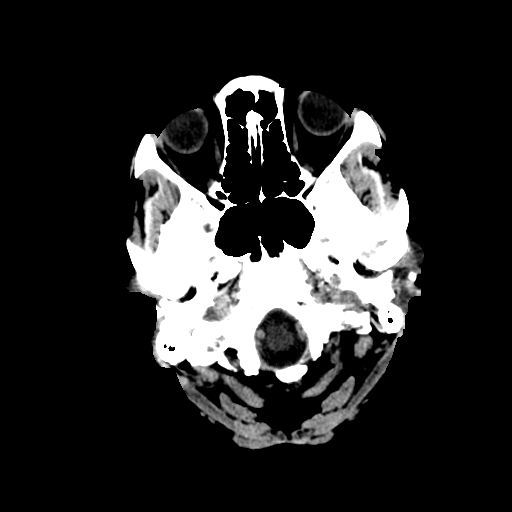
[im 2/28  bone]
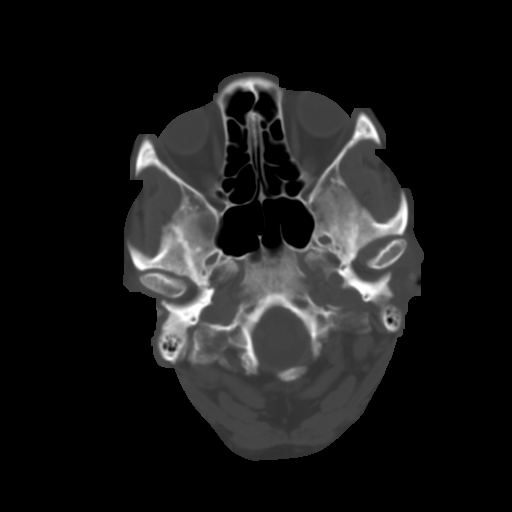
[im 4/28  brain]
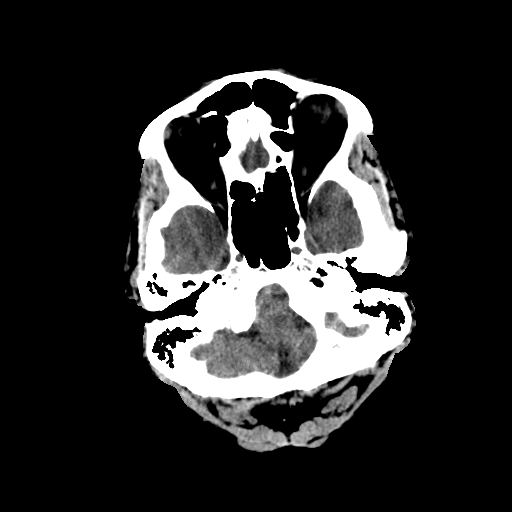
[im 6/28  brain]
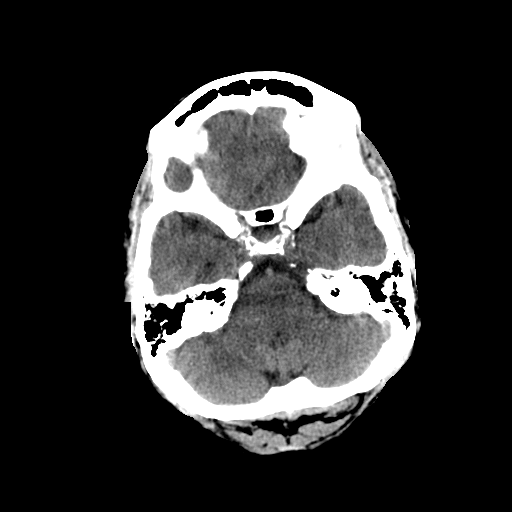
[im 8/28  brain]
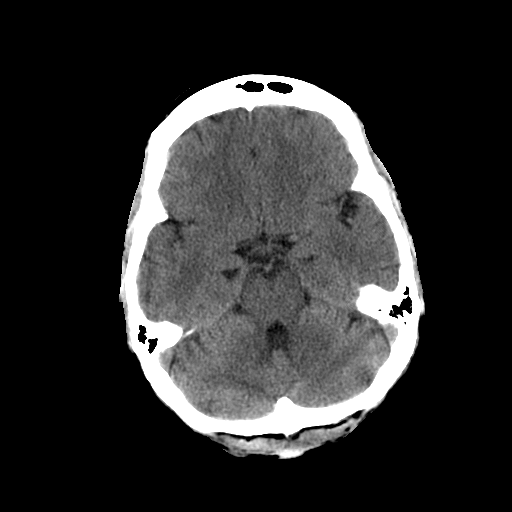
[im 10/28  brain]
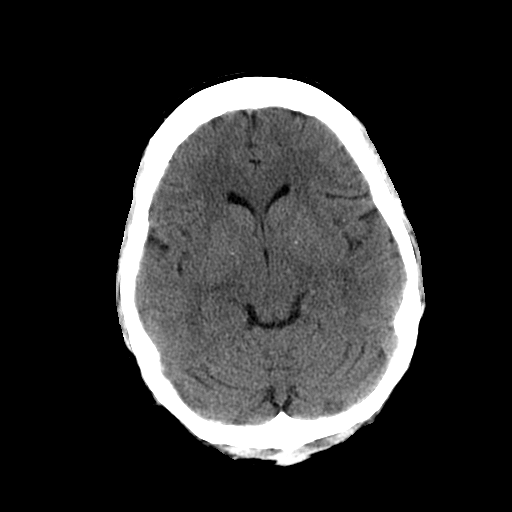
[im 10/28  bone]
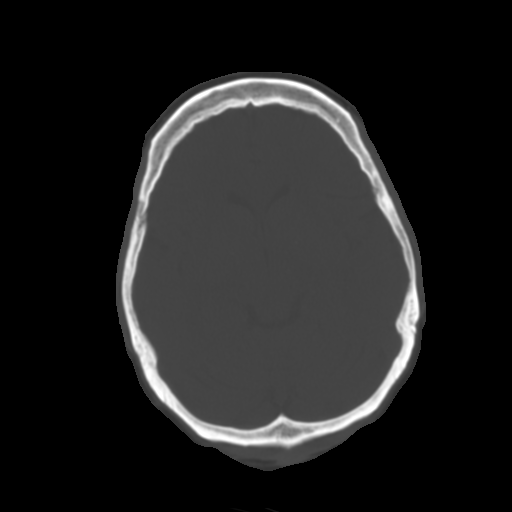
[im 12/28  brain]
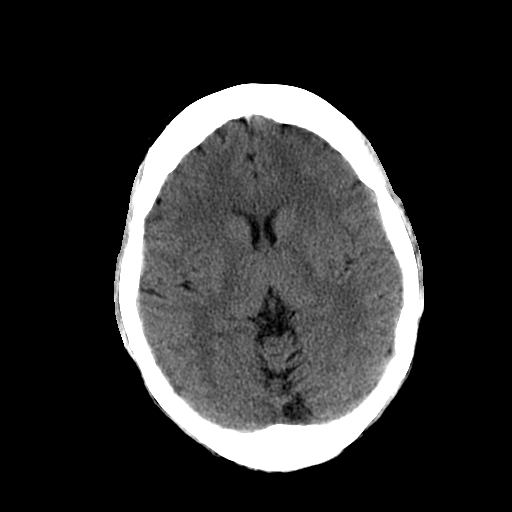
[im 14/28  brain]
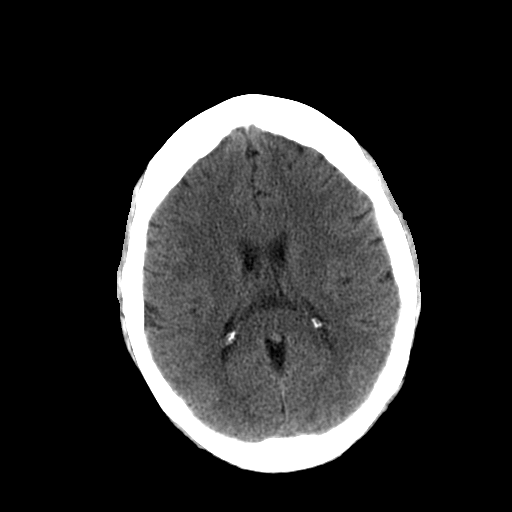
[im 16/28  brain]
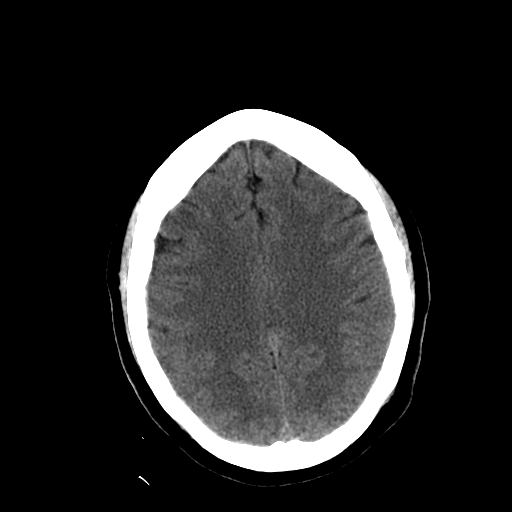
[im 18/28  brain]
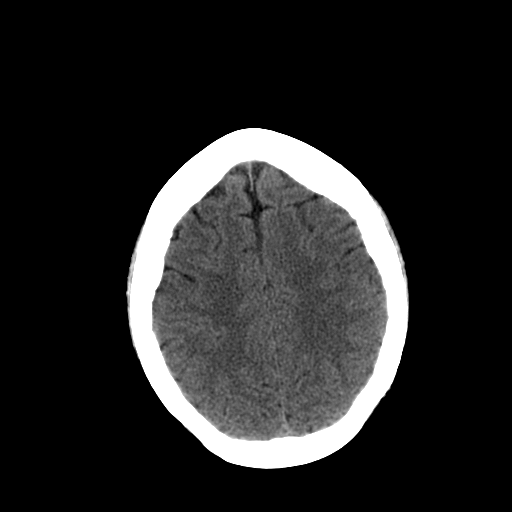
[im 18/28  bone]
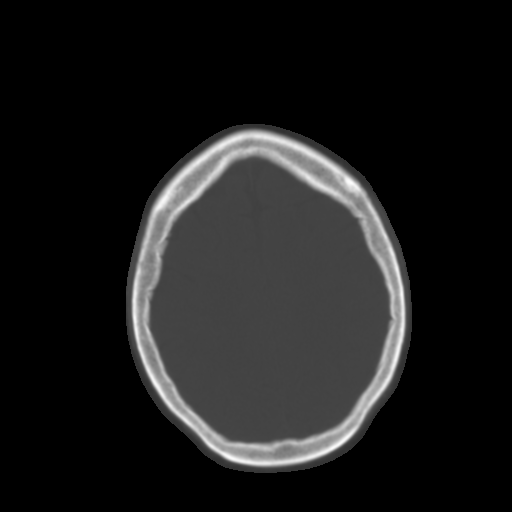
[im 20/28  brain]
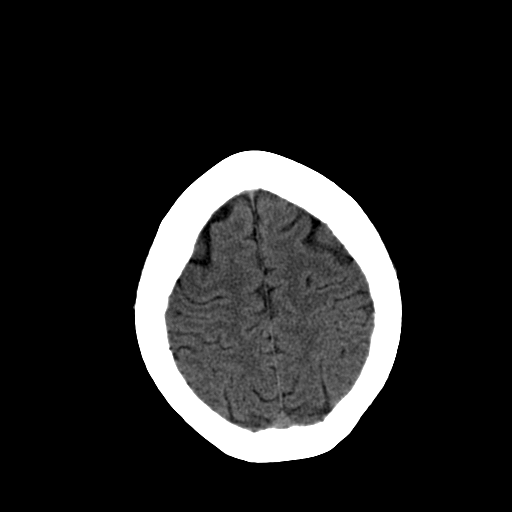
[im 22/28  brain]
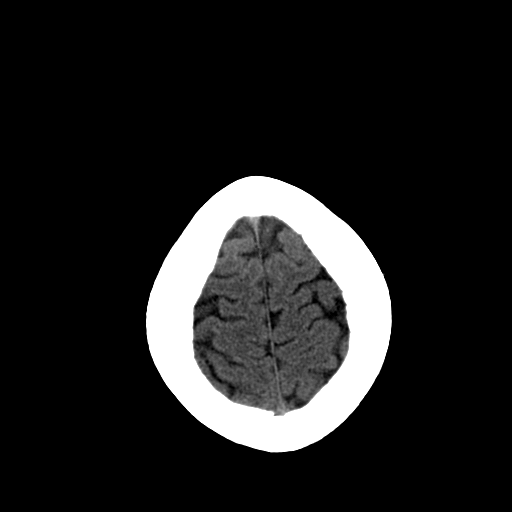
[im 24/28  brain]
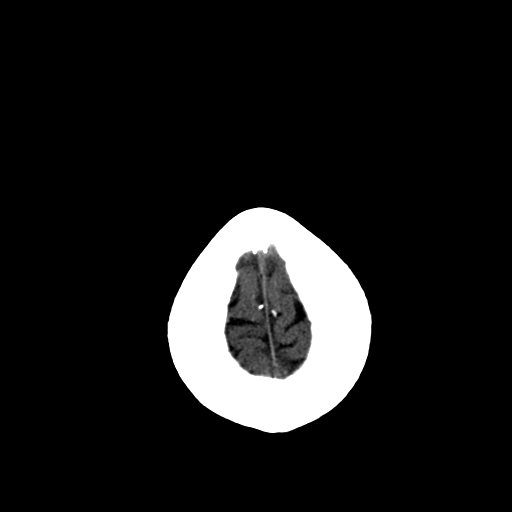
[im 26/28  brain]
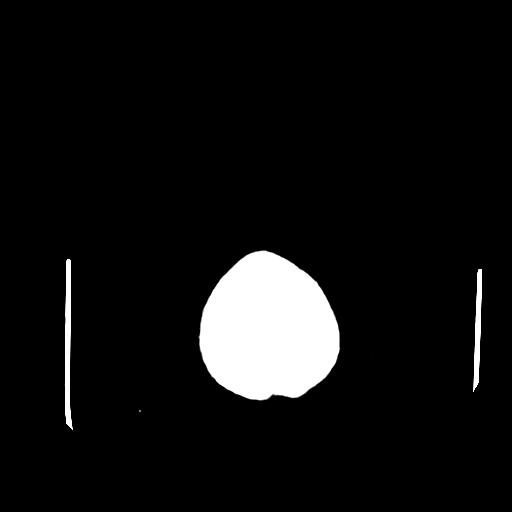
[im 26/28  bone]
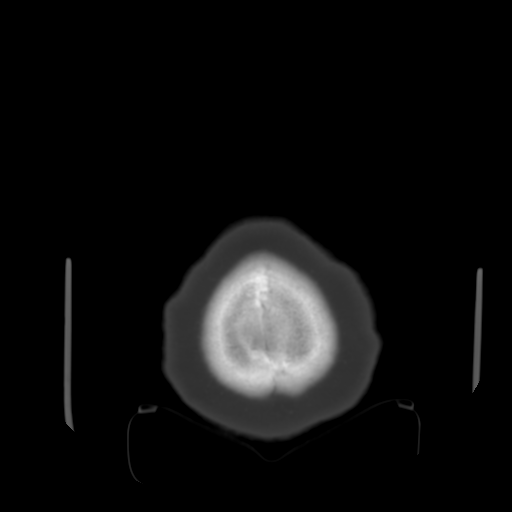

[13 of 30 positions shown; findings below may reference images not displayed]

FINDINGS: No acute intracranial abnormalities are identified,
including mass lesion or mass effect, hydrocephalus, extra-axial
fluid collection, midline shift, hemorrhage, or acute infarction.

The visualized bony calvarium is unremarkable.
IMPRESSION: No evidence of acute intracranial abnormality.

## 2012-11-27 IMAGING — CR DG CHEST 2V
1 series · 1 of 1 positions shown · non-contrast
Comparison: 11/03/2003.

CLINICAL DATA: Syncope.  Weakness.

CHEST - 2 VIEW

[view not recorded]
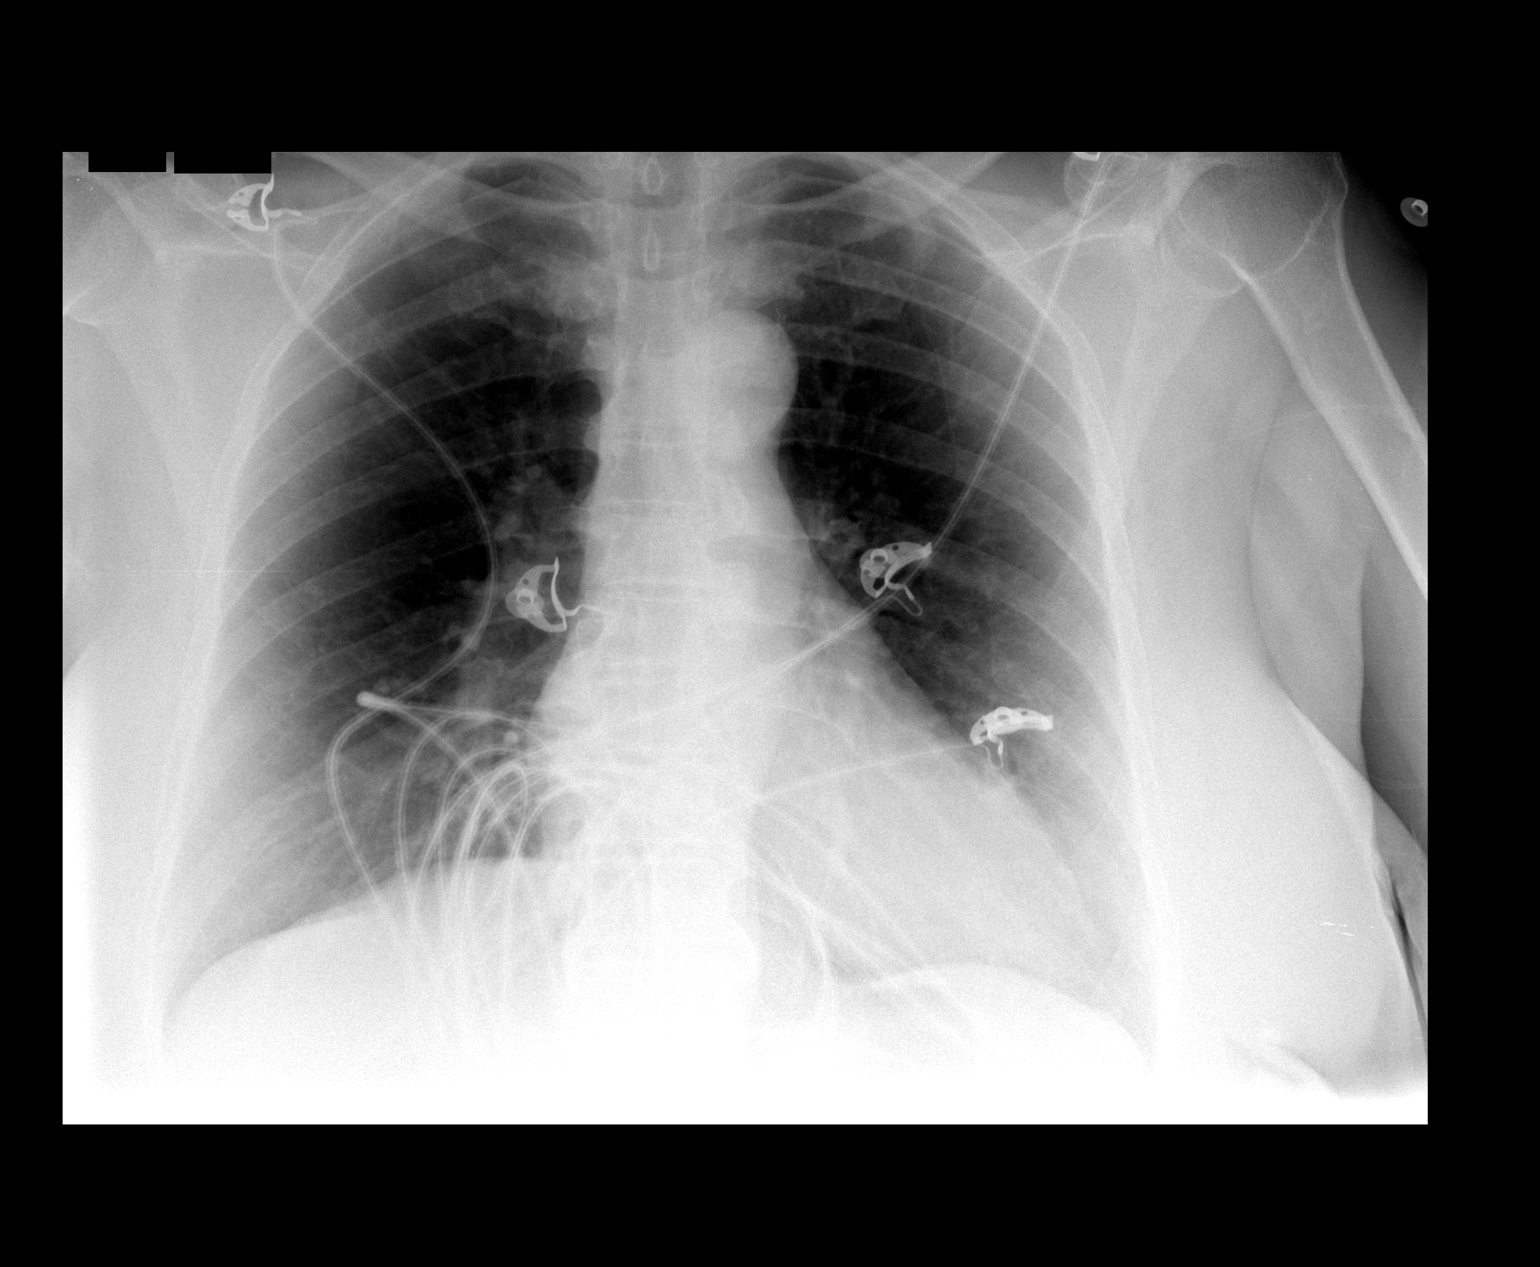

[1 of 1 positions shown; findings below may reference images not displayed]

FINDINGS: Cardiopericardial silhouette appears within normal
limits.  No airspace disease.  No effusion.  Monitoring leads are
projected over the chest.  Trachea midline.
IMPRESSION: No active disease.

## 2013-01-08 ENCOUNTER — Encounter (HOSPITAL_COMMUNITY): Payer: Self-pay | Admitting: Emergency Medicine

## 2013-01-08 ENCOUNTER — Emergency Department (HOSPITAL_COMMUNITY)
Admission: EM | Admit: 2013-01-08 | Discharge: 2013-01-09 | Disposition: A | Discharge status: Home / Self Care | Payer: Medicare PPO | Attending: Emergency Medicine | Admitting: Emergency Medicine

## 2013-01-08 DIAGNOSIS — Z79899 Other long term (current) drug therapy: Secondary | ICD-10-CM | POA: Insufficient documentation

## 2013-01-08 DIAGNOSIS — N390 Urinary tract infection, site not specified: Secondary | ICD-10-CM

## 2013-01-08 DIAGNOSIS — R42 Dizziness and giddiness: Secondary | ICD-10-CM | POA: Insufficient documentation

## 2013-01-08 LAB — CBC WITH DIFFERENTIAL/PLATELET
Basophils Absolute: 0 10*3/uL (ref 0.0–0.1)
Basophils Relative: 0 % (ref 0–1)
Eosinophils Absolute: 0.2 10*3/uL (ref 0.0–0.7)
Eosinophils Relative: 3 % (ref 0–5)
HCT: 37.6 % (ref 36.0–46.0)
Hemoglobin: 12.2 g/dL (ref 12.0–15.0)
Lymphocytes Relative: 42 % (ref 12–46)
Lymphs Abs: 2.4 10*3/uL (ref 0.7–4.0)
MCH: 27.4 pg (ref 26.0–34.0)
MCHC: 32.4 g/dL (ref 30.0–36.0)
MCV: 84.3 fL (ref 78.0–100.0)
Monocytes Absolute: 0.6 10*3/uL (ref 0.1–1.0)
Monocytes Relative: 10 % (ref 3–12)
Neutro Abs: 2.6 10*3/uL (ref 1.7–7.7)
Neutrophils Relative %: 45 % (ref 43–77)
Platelets: 256 10*3/uL (ref 150–400)
RBC: 4.46 MIL/uL (ref 3.87–5.11)
RDW: 14.6 % (ref 11.5–15.5)
WBC: 5.7 10*3/uL (ref 4.0–10.5)

## 2013-01-08 LAB — BASIC METABOLIC PANEL
BUN: 13 mg/dL (ref 6–23)
CO2: 25 mEq/L (ref 19–32)
Calcium: 9.4 mg/dL (ref 8.4–10.5)
Chloride: 101 mEq/L (ref 96–112)
Creatinine, Ser: 0.69 mg/dL (ref 0.50–1.10)
GFR calc Af Amer: >90 mL/min (ref >90)
GFR calc non Af Amer: 89 mL/min — ABNORMAL LOW (ref >90)
Glucose, Bld: 121 mg/dL — ABNORMAL HIGH (ref 70–99)
Potassium: 3.6 mEq/L (ref 3.5–5.1)
Sodium: 139 mEq/L (ref 135–145)

## 2013-01-08 LAB — GLUCOSE, CAPILLARY: Glucose-Capillary: 98 mg/dL (ref 70–99)

## 2013-01-08 MED ORDER — MECLIZINE HCL 25 MG PO TABS
25.0000 mg | ORAL_TABLET | Freq: Once | ORAL | Status: AC
  Administered 2013-01-08: 25 mg via ORAL
  Filled 2013-01-08: qty 1

## 2013-01-08 NOTE — ED Notes (Signed)
Pt states she has felt dizzy since yesterday. Denies pain. Reports her vision is normal. She is A&Ox4.

## 2013-01-08 NOTE — ED Provider Notes (Signed)
CSN: [id number]     Arrival date & time 01/08/13  1834 History   First MD Initiated Contact with Patient 01/08/13 2215     Chief Complaint  Patient presents with  . Dizziness   (Consider location/radiation/quality/duration/timing/severity/associated sxs/prior Treatment) HPI  67 year old female presents for evaluation of dizziness since last two day that has been getting worse.  Eisode happened when she tried to get up from bed Tuesday morning Sts when she woke up in the morning she has no sxs, however as soon as she starts to move her head her dizziness started.  Episodes initially intermittent but has gotten progressively worse. No injury. She describes it as room spinning with every head movement. She tries to stay still for prevention. Had one episode of vomiting 2 days ago. She denies headache, double vision, fever , chills, CP, SOB, dysuria, hematuria, frequent urination or change in BM. She had similar episode 5 year ago that subsided with medicine. She did not recall name of medicine, but sts she was diagnosed with a UTI at the same time.  No prior hx of stroke.  Not on blood thinner medication.  No recent medication changes. Has been eating and drinking as usual.      History reviewed. No pertinent past medical history. Past Surgical History  Procedure Laterality Date  . Cholecystectomy     History reviewed. No pertinent family history. History  Substance Use Topics  . Smoking status: Never Smoker   . Smokeless tobacco: Current User    Types: Snuff  . Alcohol Use: Yes   OB History   Grav Para Term Preterm Abortions TAB SAB Ect Mult Living                 Review of Systems  All other systems reviewed and are negative.    Allergies  Review of patient's allergies indicates no known allergies.  Home Medications   Current Outpatient Rx  Name  Route  Sig  Dispense  Refill  . Amlodipine-Valsartan-HCTZ (EXFORGE HCT) 10-320-25 MG TABS   Oral   Take 1 tablet by mouth  daily.         . metFORMIN (GLUCOPHAGE) 500 MG tablet   Oral   Take 500 mg by mouth daily.         . potassium chloride SA (K-DUR,KLOR-CON) 20 MEQ tablet   Oral   Take 20 mEq by mouth daily.         . rosuvastatin (CRESTOR) 10 MG tablet   Oral   Take 10 mg by mouth daily.          BP 129/72  Pulse 92  Temp(Src) 98.3 F (36.8 C) (Oral)  Resp 16  SpO2 98% Physical Exam  Nursing note and vitals reviewed. Constitutional: She appears well-developed and well-nourished. No distress.  Awake, alert, nontoxic appearance  HENT:  Head: Atraumatic.  Eyes: Conjunctivae are normal. Right eye exhibits no discharge. Left eye exhibits no discharge.  No nystagmus  Neck: Neck supple.  Cardiovascular: Normal rate and regular rhythm.   Pulmonary/Chest: Effort normal. No respiratory distress. She exhibits no tenderness.  Abdominal: Soft. There is no tenderness. There is no rebound.  Musculoskeletal: She exhibits no tenderness.  ROM appears intact, no obvious focal weakness  Neurological:  Speech clear, pupils equal round reactive to light, extraocular movements intact   Normal peripheral visual fields Cranial nerves III through XII normal including no facial droop Cranial Nerves:  II: Visual fields grossly normal with blinking noted  to bilateral confrontation, pupils equal, round, reactive to light and accommodation  III,IV, VI: ptosis not present, extra-ocular motions intact bilaterally  V,VII: smile symmetric  VIII: hearing normal bilaterally  IX,X: gag reflex present  XI: bilateral shoulder shrug  XII: midline tongue extension Follows commands, moves all extremities x4, normal strength to bilateral upper and lower extremities at all major muscle groups including grip Sensation normal to light touch  Coordination intact, no limb ataxia, finger-nose-finger slow but normal Rapid alternating movements slow but normal No pronator drift Gait normal   Skin: No rash noted.   Psychiatric: She has a normal mood and affect.    ED Course  Procedures (including critical care time)  11:37 PM Pt without significant risk factor for stroke except age.  She has vertiginous sxs suggestive of peripheral vertigo.  Doubt stroke.  No other complaint.  Dizziness inducible with positional changes with Dix Halpike maneuver.  Will give meclizine and work up initiated. Will also check orthostatic vital sign. Care discussed with Dr. Freida Busman.    12:52 AM Care discussed with oncoming PA who will continue to monitor pt.    Labs Review Labs Reviewed  BASIC METABOLIC PANEL - Abnormal; Notable for the following:    Glucose, Bld 121 (*)    GFR calc non Af Amer 89 (*)    All other components within normal limits  GLUCOSE, CAPILLARY  CBC WITH DIFFERENTIAL  URINALYSIS, ROUTINE W REFLEX MICROSCOPIC   Imaging Review No results found.  EKG Interpretation    Date/Time:  Thursday January 08 2013 18:55:46 EST Ventricular Rate:  94 PR Interval:  186 QRS Duration: 92 QT Interval:  370 QTC Calculation: 462 R Axis:   -80 Text Interpretation:  Normal sinus rhythm Left axis deviation Poor R wave progression Non-specific ST-t changes Confirmed by KOHUT  MD, STEPHEN ([id number]) on 01/08/2013 10:52:03 PM            MDM   1. Vertigo   2. UTI (lower urinary tract infection)    BP 121/66  Pulse 95  Temp(Src) 98.3 F (36.8 C) (Oral)  Resp 15  SpO2 98%     Fayrene Helper, PA-C 01/10/13 [phone]

## 2013-01-08 NOTE — ED Notes (Signed)
Phlebotomy at bedside.

## 2013-01-08 NOTE — ED Notes (Signed)
Bowie Tran, PA at bedside 

## 2013-01-09 LAB — URINALYSIS, ROUTINE W REFLEX MICROSCOPIC
Bilirubin Urine: NEGATIVE
Glucose, UA: NEGATIVE mg/dL
Hgb urine dipstick: NEGATIVE
Ketones, ur: NEGATIVE mg/dL
Nitrite: NEGATIVE
Protein, ur: NEGATIVE mg/dL
Specific Gravity, Urine: 1.016 (ref 1.005–1.030)
Urobilinogen, UA: 0.2 mg/dL (ref 0.0–1.0)
pH: 5 (ref 5.0–8.0)

## 2013-01-09 LAB — URINE MICROSCOPIC-ADD ON

## 2013-01-09 MED ORDER — CEPHALEXIN 500 MG PO CAPS: 500.0000 mg | ORAL_CAPSULE | Freq: Four times a day (QID) | ORAL | Status: AC

## 2013-01-09 MED ORDER — SODIUM CHLORIDE 0.9 % IV BOLUS (SEPSIS)
1000.0000 mL | Freq: Once | INTRAVENOUS | Status: AC
  Administered 2013-01-09: 1000 mL via INTRAVENOUS

## 2013-01-09 MED ORDER — MECLIZINE HCL 25 MG PO TABS: 25.0000 mg | ORAL_TABLET | Freq: Four times a day (QID) | ORAL | Status: AC

## 2013-01-09 MED ORDER — SODIUM CHLORIDE 0.9 % IV SOLN: INTRAVENOUS | Status: DC

## 2013-01-09 NOTE — ED Notes (Signed)
Upon discharge,  Pt A&oX4. Pt wheeled out of emergency department in wheelchair.

## 2013-01-09 NOTE — ED Provider Notes (Signed)
Medical screening examination/treatment/procedure(s) were conducted as a shared visit with non-physician practitioner(s) and myself.  I personally evaluated the patient during the encounter.  EKG Interpretation    Date/Time:  Thursday January 08 2013 18:55:46 EST Ventricular Rate:  94 PR Interval:  186 QRS Duration: 92 QT Interval:  370 QTC Calculation: 462 R Axis:   -80 Text Interpretation:  Normal sinus rhythm Left axis deviation Poor R wave progression Non-specific ST-t changes Confirmed by Juleen China  MD, STEPHEN ([id number]) on 01/08/2013 10:52:03 PM           Patient here with dizziness consistent with peripheral vertigo. Was given Antivert and feels better. Urinalysis shows infection that'll be treated. Neurological exam at time of discharge remained stable.  Toy Baker, MD 01/09/13 [phone]

## 2013-01-09 NOTE — ED Notes (Addendum)
Pt ambulated about 10 steps in hallway when she began to feel wobbly and stated she was very dizzy. O2-98% RA and HR-108. Greta Doom, PA witnessed patient ambulating. Pt escorted back to room.

## 2013-01-10 LAB — URINE CULTURE: Colony Count: 6000

## 2013-01-12 NOTE — ED Provider Notes (Signed)
Medical screening examination/treatment/procedure(s) were performed by non-physician practitioner and as supervising physician I was immediately available for consultation/collaboration.  EKG Interpretation    Date/Time:  Thursday January 08 2013 18:55:46 EST Ventricular Rate:  94 PR Interval:  186 QRS Duration: 92 QT Interval:  370 QTC Calculation: 462 R Axis:   -80 Text Interpretation:  Normal sinus rhythm Left axis deviation Poor R wave progression Non-specific ST-t changes Confirmed by Juleen China  MD, Hilja Kintzel ([id number]) on 01/08/2013 10:52:03 PM             Raeford Razor, MD 01/12/13 [phone]

## 2013-06-10 ENCOUNTER — Other Ambulatory Visit (HOSPITAL_COMMUNITY): Payer: Self-pay | Admitting: Family Medicine

## 2013-06-10 DIAGNOSIS — Z1231 Encounter for screening mammogram for malignant neoplasm of breast: Secondary | ICD-10-CM

## 2013-07-15 ENCOUNTER — Ambulatory Visit (HOSPITAL_COMMUNITY)
Admission: RE | Admit: 2013-07-15 | Discharge: 2013-07-15 | Disposition: A | Discharge status: Home / Self Care | Payer: Medicare HMO | Source: Ambulatory Visit | Attending: Family Medicine | Admitting: Family Medicine

## 2013-07-15 DIAGNOSIS — Z1231 Encounter for screening mammogram for malignant neoplasm of breast: Secondary | ICD-10-CM | POA: Insufficient documentation

## 2014-09-23 ENCOUNTER — Other Ambulatory Visit (HOSPITAL_COMMUNITY): Payer: Self-pay | Admitting: Family Medicine

## 2014-09-23 DIAGNOSIS — Z1231 Encounter for screening mammogram for malignant neoplasm of breast: Secondary | ICD-10-CM

## 2014-09-29 ENCOUNTER — Ambulatory Visit (HOSPITAL_COMMUNITY)
Admission: RE | Admit: 2014-09-29 | Discharge: 2014-09-29 | Disposition: A | Discharge status: Home / Self Care | Payer: PPO | Source: Ambulatory Visit | Attending: Family Medicine | Admitting: Family Medicine

## 2014-09-29 DIAGNOSIS — Z1231 Encounter for screening mammogram for malignant neoplasm of breast: Secondary | ICD-10-CM | POA: Insufficient documentation

## 2015-02-09 DIAGNOSIS — E089 Diabetes mellitus due to underlying condition without complications: Secondary | ICD-10-CM | POA: Diagnosis not present

## 2015-02-09 DIAGNOSIS — I10 Essential (primary) hypertension: Secondary | ICD-10-CM | POA: Diagnosis not present

## 2015-02-09 DIAGNOSIS — E782 Mixed hyperlipidemia: Secondary | ICD-10-CM | POA: Diagnosis not present

## 2015-02-09 DIAGNOSIS — E118 Type 2 diabetes mellitus with unspecified complications: Secondary | ICD-10-CM | POA: Diagnosis not present

## 2015-06-15 DIAGNOSIS — E089 Diabetes mellitus due to underlying condition without complications: Secondary | ICD-10-CM | POA: Diagnosis not present

## 2015-06-15 DIAGNOSIS — I10 Essential (primary) hypertension: Secondary | ICD-10-CM | POA: Diagnosis not present

## 2015-06-15 DIAGNOSIS — E782 Mixed hyperlipidemia: Secondary | ICD-10-CM | POA: Diagnosis not present

## 2015-06-15 DIAGNOSIS — M130 Polyarthritis, unspecified: Secondary | ICD-10-CM | POA: Diagnosis not present

## 2015-08-17 DIAGNOSIS — E089 Diabetes mellitus due to underlying condition without complications: Secondary | ICD-10-CM | POA: Diagnosis not present

## 2015-08-17 DIAGNOSIS — M130 Polyarthritis, unspecified: Secondary | ICD-10-CM | POA: Diagnosis not present

## 2015-08-17 DIAGNOSIS — I10 Essential (primary) hypertension: Secondary | ICD-10-CM | POA: Diagnosis not present

## 2015-09-05 ENCOUNTER — Other Ambulatory Visit: Payer: Self-pay | Admitting: Family Medicine

## 2015-09-05 DIAGNOSIS — Z1231 Encounter for screening mammogram for malignant neoplasm of breast: Secondary | ICD-10-CM

## 2015-09-15 DIAGNOSIS — M10071 Idiopathic gout, right ankle and foot: Secondary | ICD-10-CM | POA: Diagnosis not present

## 2015-09-15 DIAGNOSIS — M1000 Idiopathic gout, unspecified site: Secondary | ICD-10-CM | POA: Diagnosis not present

## 2015-09-15 DIAGNOSIS — E782 Mixed hyperlipidemia: Secondary | ICD-10-CM | POA: Diagnosis not present

## 2015-09-15 DIAGNOSIS — Z6834 Body mass index (BMI) 34.0-34.9, adult: Secondary | ICD-10-CM | POA: Diagnosis not present

## 2015-09-30 ENCOUNTER — Ambulatory Visit
Admission: RE | Admit: 2015-09-30 | Discharge: 2015-09-30 | Disposition: A | Discharge status: Home / Self Care | Payer: PPO | Source: Ambulatory Visit | Attending: Family Medicine | Admitting: Family Medicine

## 2015-09-30 DIAGNOSIS — Z1231 Encounter for screening mammogram for malignant neoplasm of breast: Secondary | ICD-10-CM | POA: Diagnosis not present

## 2015-10-03 DIAGNOSIS — M1000 Idiopathic gout, unspecified site: Secondary | ICD-10-CM | POA: Diagnosis not present

## 2015-11-16 DIAGNOSIS — M130 Polyarthritis, unspecified: Secondary | ICD-10-CM | POA: Diagnosis not present

## 2015-11-16 DIAGNOSIS — M1000 Idiopathic gout, unspecified site: Secondary | ICD-10-CM | POA: Diagnosis not present

## 2015-11-16 DIAGNOSIS — I10 Essential (primary) hypertension: Secondary | ICD-10-CM | POA: Diagnosis not present

## 2015-11-16 DIAGNOSIS — Z23 Encounter for immunization: Secondary | ICD-10-CM | POA: Diagnosis not present

## 2015-11-16 DIAGNOSIS — E118 Type 2 diabetes mellitus with unspecified complications: Secondary | ICD-10-CM | POA: Diagnosis not present

## 2016-03-13 DIAGNOSIS — M130 Polyarthritis, unspecified: Secondary | ICD-10-CM | POA: Diagnosis not present

## 2016-03-13 DIAGNOSIS — E782 Mixed hyperlipidemia: Secondary | ICD-10-CM | POA: Diagnosis not present

## 2016-03-13 DIAGNOSIS — E089 Diabetes mellitus due to underlying condition without complications: Secondary | ICD-10-CM | POA: Diagnosis not present

## 2016-03-13 DIAGNOSIS — M1000 Idiopathic gout, unspecified site: Secondary | ICD-10-CM | POA: Diagnosis not present

## 2016-03-13 DIAGNOSIS — I10 Essential (primary) hypertension: Secondary | ICD-10-CM | POA: Diagnosis not present

## 2016-03-16 DIAGNOSIS — E118 Type 2 diabetes mellitus with unspecified complications: Secondary | ICD-10-CM | POA: Diagnosis not present

## 2016-03-16 DIAGNOSIS — M130 Polyarthritis, unspecified: Secondary | ICD-10-CM | POA: Diagnosis not present

## 2016-03-16 DIAGNOSIS — I10 Essential (primary) hypertension: Secondary | ICD-10-CM | POA: Diagnosis not present

## 2016-03-16 DIAGNOSIS — M1000 Idiopathic gout, unspecified site: Secondary | ICD-10-CM | POA: Diagnosis not present

## 2016-04-05 DIAGNOSIS — Z1211 Encounter for screening for malignant neoplasm of colon: Secondary | ICD-10-CM | POA: Diagnosis not present

## 2016-04-16 DIAGNOSIS — K635 Polyp of colon: Secondary | ICD-10-CM | POA: Diagnosis not present

## 2016-04-16 DIAGNOSIS — Z01818 Encounter for other preprocedural examination: Secondary | ICD-10-CM | POA: Diagnosis not present

## 2016-04-16 DIAGNOSIS — E119 Type 2 diabetes mellitus without complications: Secondary | ICD-10-CM | POA: Diagnosis not present

## 2016-04-16 DIAGNOSIS — Z1211 Encounter for screening for malignant neoplasm of colon: Secondary | ICD-10-CM | POA: Diagnosis not present

## 2016-04-23 DIAGNOSIS — K635 Polyp of colon: Secondary | ICD-10-CM | POA: Diagnosis not present

## 2016-04-26 DIAGNOSIS — Z86010 Personal history of colonic polyps: Secondary | ICD-10-CM | POA: Diagnosis not present

## 2016-05-01 ENCOUNTER — Other Ambulatory Visit: Payer: Self-pay | Admitting: Family Medicine

## 2016-05-01 ENCOUNTER — Ambulatory Visit
Admission: RE | Admit: 2016-05-01 | Discharge: 2016-05-01 | Disposition: A | Discharge status: Home / Self Care | Payer: PPO | Source: Ambulatory Visit | Attending: Family Medicine | Admitting: Family Medicine

## 2016-05-01 DIAGNOSIS — N390 Urinary tract infection, site not specified: Secondary | ICD-10-CM | POA: Diagnosis not present

## 2016-05-01 DIAGNOSIS — M1611 Unilateral primary osteoarthritis, right hip: Secondary | ICD-10-CM | POA: Diagnosis not present

## 2016-05-01 DIAGNOSIS — M130 Polyarthritis, unspecified: Secondary | ICD-10-CM

## 2016-05-01 DIAGNOSIS — E0800 Diabetes mellitus due to underlying condition with hyperosmolarity without nonketotic hyperglycemic-hyperosmolar coma (NKHHC): Secondary | ICD-10-CM | POA: Diagnosis not present

## 2016-05-01 DIAGNOSIS — I10 Essential (primary) hypertension: Secondary | ICD-10-CM | POA: Diagnosis not present

## 2016-05-15 DIAGNOSIS — N390 Urinary tract infection, site not specified: Secondary | ICD-10-CM | POA: Diagnosis not present

## 2016-05-31 DIAGNOSIS — M130 Polyarthritis, unspecified: Secondary | ICD-10-CM | POA: Diagnosis not present

## 2016-05-31 DIAGNOSIS — E782 Mixed hyperlipidemia: Secondary | ICD-10-CM | POA: Diagnosis not present

## 2016-05-31 DIAGNOSIS — M1000 Idiopathic gout, unspecified site: Secondary | ICD-10-CM | POA: Diagnosis not present

## 2016-05-31 DIAGNOSIS — I10 Essential (primary) hypertension: Secondary | ICD-10-CM | POA: Diagnosis not present

## 2016-05-31 DIAGNOSIS — Z0000 Encounter for general adult medical examination without abnormal findings: Secondary | ICD-10-CM | POA: Diagnosis not present

## 2016-05-31 DIAGNOSIS — D649 Anemia, unspecified: Secondary | ICD-10-CM | POA: Diagnosis not present

## 2016-06-03 ENCOUNTER — Emergency Department (HOSPITAL_COMMUNITY): Payer: PPO

## 2016-06-03 ENCOUNTER — Emergency Department (HOSPITAL_COMMUNITY)
Admission: EM | Admit: 2016-06-03 | Discharge: 2016-06-03 | Disposition: A | Discharge status: Home / Self Care | Payer: PPO | Attending: Emergency Medicine | Admitting: Emergency Medicine

## 2016-06-03 ENCOUNTER — Encounter (HOSPITAL_COMMUNITY): Payer: Self-pay

## 2016-06-03 DIAGNOSIS — R000 Tachycardia, unspecified: Secondary | ICD-10-CM

## 2016-06-03 DIAGNOSIS — I10 Essential (primary) hypertension: Secondary | ICD-10-CM | POA: Diagnosis not present

## 2016-06-03 DIAGNOSIS — Z7984 Long term (current) use of oral hypoglycemic drugs: Secondary | ICD-10-CM | POA: Diagnosis not present

## 2016-06-03 DIAGNOSIS — R079 Chest pain, unspecified: Secondary | ICD-10-CM | POA: Insufficient documentation

## 2016-06-03 DIAGNOSIS — E119 Type 2 diabetes mellitus without complications: Secondary | ICD-10-CM | POA: Diagnosis not present

## 2016-06-03 DIAGNOSIS — R0989 Other specified symptoms and signs involving the circulatory and respiratory systems: Secondary | ICD-10-CM | POA: Diagnosis not present

## 2016-06-03 HISTORY — DX: Essential (primary) hypertension: I10

## 2016-06-03 HISTORY — DX: Type 2 diabetes mellitus without complications: E11.9

## 2016-06-03 LAB — CBC
HCT: 32.9 % — ABNORMAL LOW (ref 36.0–46.0)
Hemoglobin: 10.6 g/dL — ABNORMAL LOW (ref 12.0–15.0)
MCH: 26.2 pg (ref 26.0–34.0)
MCHC: 32.2 g/dL (ref 30.0–36.0)
MCV: 81.2 fL (ref 78.0–100.0)
Platelets: 268 10*3/uL (ref 150–400)
RBC: 4.05 MIL/uL (ref 3.87–5.11)
RDW: 15 % (ref 11.5–15.5)
WBC: 10.3 10*3/uL (ref 4.0–10.5)

## 2016-06-03 LAB — TROPONIN I
Troponin I: <0.03 ng/mL (ref <0.03)
Troponin I: <0.03 ng/mL (ref <0.03)

## 2016-06-03 LAB — TSH: TSH: 2.739 u[IU]/mL (ref 0.350–4.500)

## 2016-06-03 LAB — COMPREHENSIVE METABOLIC PANEL
ALT: 23 U/L (ref 14–54)
AST: 19 U/L (ref 15–41)
Albumin: 3.8 g/dL (ref 3.5–5.0)
Alkaline Phosphatase: 100 U/L (ref 38–126)
Anion gap: 9 (ref 5–15)
BUN: 15 mg/dL (ref 6–20)
CO2: 20 mmol/L — ABNORMAL LOW (ref 22–32)
Calcium: 8.4 mg/dL — ABNORMAL LOW (ref 8.9–10.3)
Chloride: 106 mmol/L (ref 101–111)
Creatinine, Ser: 0.9 mg/dL (ref 0.44–1.00)
GFR calc Af Amer: >60 mL/min (ref >60)
GFR calc non Af Amer: >60 mL/min (ref >60)
Glucose, Bld: 126 mg/dL — ABNORMAL HIGH (ref 65–99)
Potassium: 4 mmol/L (ref 3.5–5.1)
Sodium: 135 mmol/L (ref 135–145)
Total Bilirubin: 0.3 mg/dL (ref 0.3–1.2)
Total Protein: 7.1 g/dL (ref 6.5–8.1)

## 2016-06-03 LAB — I-STAT TROPONIN, ED
Troponin i, poc: 0 ng/mL (ref 0.00–0.08)
Troponin i, poc: 0 ng/mL (ref 0.00–0.08)

## 2016-06-03 LAB — CBG MONITORING, ED: Glucose-Capillary: 119 mg/dL — ABNORMAL HIGH (ref 65–99)

## 2016-06-03 LAB — D-DIMER, QUANTITATIVE (NOT AT ARMC): D-Dimer, Quant: 0.57 ug/mL-FEU — ABNORMAL HIGH (ref 0.00–0.50)

## 2016-06-03 MED ORDER — ASPIRIN 81 MG PO CHEW
324.0000 mg | CHEWABLE_TABLET | Freq: Once | ORAL | Status: AC
  Administered 2016-06-03: 324 mg via ORAL
  Filled 2016-06-03: qty 4

## 2016-06-03 MED ORDER — AMLODIPINE BESYLATE-VALSARTAN 5-160 MG PO TABS: 1.0000 | ORAL_TABLET | Freq: Every day | ORAL | 0 refills | Status: AC

## 2016-06-03 MED ORDER — METOPROLOL TARTRATE 25 MG PO TABS: 12.5000 mg | ORAL_TABLET | Freq: Two times a day (BID) | ORAL | 0 refills | Status: AC

## 2016-06-03 MED ORDER — METOPROLOL TARTRATE 5 MG/5ML IV SOLN
5.0000 mg | Freq: Once | INTRAVENOUS | Status: AC
  Administered 2016-06-03: 5 mg via INTRAVENOUS
  Filled 2016-06-03: qty 5

## 2016-06-03 MED ORDER — IOPAMIDOL (ISOVUE-370) INJECTION 76%
INTRAVENOUS | Status: AC
  Administered 2016-06-03: 100 mL
  Filled 2016-06-03: qty 100

## 2016-06-03 NOTE — ED Provider Notes (Signed)
[facility]-EMERGENCY DEPT Provider Note   CSN: [id number] Arrival date & time: 06/03/16  0908     History   Chief Complaint Chief Complaint  Patient presents with  . Chest Pain    HPI Terri Mcconnell is a 71 y.o. female.  Pt presents to the ED today with CP and feeling like her heart is beating too fast.  This started last night.  Pt also gets very sob when lying flat.  She has never had anything like this in the past.      Past Medical History:  Diagnosis Date  . Diabetes mellitus without complication (HCC)   . Hypertension     There are no active problems to display for this patient.   Past Surgical History:  Procedure Laterality Date  . CHOLECYSTECTOMY      OB History    No data available       Home Medications    Prior to Admission medications   Medication Sig Start Date End Date Taking? Authorizing Provider  ferrous sulfate 325 (65 FE) MG tablet Take 325 mg by mouth daily with breakfast.   Yes Historical Provider, MD  metFORMIN (GLUCOPHAGE) 500 MG tablet Take 500 mg by mouth daily.   Yes Historical Provider, MD  potassium chloride SA (K-DUR,KLOR-CON) 20 MEQ tablet Take 20 mEq by mouth daily.   Yes Historical Provider, MD  rosuvastatin (CRESTOR) 10 MG tablet Take 10 mg by mouth daily.   Yes Historical Provider, MD  amLODipine-valsartan (EXFORGE) 5-160 MG tablet Take 1 tablet by mouth daily. 06/03/16   Jacalyn Lefevre, MD  cephALEXin (KEFLEX) 500 MG capsule Take 1 capsule (500 mg total) by mouth 4 (four) times daily. Patient not taking: Reported on 06/03/2016 01/09/13   Lorre Nick, MD  meclizine (ANTIVERT) 25 MG tablet Take 1 tablet (25 mg total) by mouth 4 (four) times daily. Patient not taking: Reported on 06/03/2016 01/09/13   Lorre Nick, MD  metoprolol tartrate (LOPRESSOR) 25 MG tablet Take 0.5 tablets (12.5 mg total) by mouth 2 (two) times daily. 06/03/16   Jacalyn Lefevre, MD    Family History History reviewed. No pertinent family history.  Social  History Social History  Substance Use Topics  . Smoking status: Never Smoker  . Smokeless tobacco: Current User    Types: Snuff  . Alcohol use Yes     Allergies   Patient has no known allergies.   Review of Systems Review of Systems  Respiratory: Positive for shortness of breath.   Cardiovascular: Positive for chest pain and palpitations.  All other systems reviewed and are negative.    Physical Exam Updated Vital Signs BP (!) 112/57   Pulse 88   Temp 99.2 F (37.3 C) (Oral)   Resp (!) 21   Ht  (1.651 m)   Wt 202 lb (91.6 kg)   SpO2 99%   BMI 33.61 kg/m   Physical Exam  Constitutional: She is oriented to person, place, and time. She appears well-developed and well-nourished.  HENT:  Head: Normocephalic and atraumatic.  Right Ear: External ear normal.  Left Ear: External ear normal.  Nose: Nose normal.  Mouth/Throat: Oropharynx is clear and moist.  Eyes: Conjunctivae and EOM are normal. Pupils are equal, round, and reactive to light.  Neck: Normal range of motion. Neck supple.  Cardiovascular: Regular rhythm, normal heart sounds and intact distal pulses.  Tachycardia present.   Pulmonary/Chest: Effort normal and breath sounds normal.  Abdominal: Soft. Bowel sounds are normal.  Musculoskeletal: Normal  range of motion.  Neurological: She is alert and oriented to person, place, and time.  Skin: Skin is warm and dry.  Psychiatric: She has a normal mood and affect. Her behavior is normal. Judgment and thought content normal.  Nursing note and vitals reviewed.    ED Treatments / Results  Labs (all labs ordered are listed, but only abnormal results are displayed) Labs Reviewed  CBC - Abnormal; Notable for the following:       Result Value   Hemoglobin 10.6 (*)    HCT 32.9 (*)    All other components within normal limits  COMPREHENSIVE METABOLIC PANEL - Abnormal; Notable for the following:    CO2 20 (*)    Glucose, Bld 126 (*)    Calcium 8.4 (*)    All  other components within normal limits  D-DIMER, QUANTITATIVE (NOT AT A [facility]) - Abnormal; Notable for the following:    D-Dimer, Quant 0.57 (*)    All other components within normal limits  CBG MONITORING, ED - Abnormal; Notable for the following:    Glucose-Capillary 119 (*)    All other components within normal limits  TROPONIN I  TSH  TROPONIN I  I-STAT TROPOININ, ED  I-STAT TROPOININ, ED    EKG  EKG Interpretation None       Radiology Dg Chest 2 View  Result Date: 06/03/2016 CLINICAL DATA:  Patient reports having a fluttering feeling in the middle of her chest yesterday, denies the feeling today. EXAM: CHEST  2 VIEW COMPARISON:  10/22/2010 FINDINGS: The heart size and mediastinal contours are within normal limits. Both lungs are clear. The visualized skeletal structures are unremarkable. IMPRESSION: No active cardiopulmonary disease. Electronically Signed   By: Elige Ko   On: 06/03/2016 09:44   Ct Angio Chest Pe W And/or Wo Contrast  Result Date: 06/03/2016 CLINICAL DATA:  Chest pain and shortness of breath since last night. EXAM: CT ANGIOGRAPHY CHEST WITH CONTRAST TECHNIQUE: Multidetector CT imaging of the chest was performed using the standard protocol during bolus administration of intravenous contrast. Multiplanar CT image reconstructions and MIPs were obtained to evaluate the vascular anatomy. CONTRAST:  100 mL of Isovue 370 intravenous contrast COMPARISON:  Current chest radiograph FINDINGS: Cardiovascular: Satisfactory opacification of the pulmonary arteries to the segmental level. No evidence of pulmonary embolism. Normal heart size. No pericardial effusion. There are mile coronal rib calcifications. The aorta is normal in caliber. No aortic dissection. Mediastinum/Nodes: No neck base or axillary masses or adenopathy. Small hiatal hernia. Questionable wall thickening versus a mural mass along the left margin of the hiatal hernia. The trachea is widely patent. No mediastinal  or hilar masses or enlarged lymph nodes. Lungs/Pleura: Lungs are clear. No pleural effusion or pneumothorax. Upper Abdomen: No acute abnormality. Musculoskeletal: No acute fracture. No osteoblastic or osteolytic lesions. Review of the MIP images confirms the above findings. IMPRESSION: 1. No evidence of a pulmonary embolism. 2. No acute findings. 3. Small hiatal hernia with questionable wall thickening or mural mass. This could be further evaluated, a nonemergent basis, with a a barium swallow or endoscopy. Electronically Signed   By: Amie Portland M.D.   On: 06/03/2016 11:10    Procedures Procedures (including critical care time)  Medications Ordered in ED Medications  aspirin chewable tablet 324 mg (324 mg Oral Given 06/03/16 0954)  metoprolol (LOPRESSOR) injection 5 mg (5 mg Intravenous Given 06/03/16 0955)  iopamidol (ISOVUE-370) 76 % injection (100 mLs  Contrast Given 06/03/16 1045)  Initial Impression / Assessment and Plan / ED Course  I have reviewed the triage vital signs and the nursing notes.  Pertinent labs & imaging results that were available during my care of the patient were reviewed by me and considered in my medical decision making (see chart for details).    Pt feels much better after 5 mg of lopressor slowed her HR to 80.  Pt's exforge will be decreased in half and I will add lopressor to her regimen.  The pt is told to f/u with her pcp and with cardiology.  Return if worse.  Final Clinical Impressions(s) / ED Diagnoses   Final diagnoses:  Tachycardia    New Prescriptions New Prescriptions   AMLODIPINE-VALSARTAN (EXFORGE) 5-160 MG TABLET    Take 1 tablet by mouth daily.   METOPROLOL TARTRATE (LOPRESSOR) 25 MG TABLET    Take 0.5 tablets (12.5 mg total) by mouth 2 (two) times daily.     Jacalyn Lefevre, MD 06/03/16 [phone]

## 2016-06-03 NOTE — ED Notes (Signed)
Patient transported to X-ray on telemetry monitor at this time.  

## 2016-06-03 NOTE — Discharge Instructions (Signed)
Decrease exforge in half while on lopressor.

## 2016-06-03 NOTE — ED Notes (Signed)
Patient transported to X-ray 

## 2016-06-03 NOTE — ED Notes (Signed)
Patient transported to CT on the telemetry monitor at this time.

## 2016-06-03 NOTE — ED Triage Notes (Signed)
Pt. Here for chest burning starting yesterday. Pt. Hx of DM and HTN. Pt. Also states her dad had a MI at a young age. Pt. c/o SOB when lying down. EDP at bedside.

## 2016-08-30 ENCOUNTER — Other Ambulatory Visit: Payer: Self-pay | Admitting: Family Medicine

## 2016-08-30 DIAGNOSIS — Z1231 Encounter for screening mammogram for malignant neoplasm of breast: Secondary | ICD-10-CM

## 2016-09-05 ENCOUNTER — Ambulatory Visit
Admission: RE | Admit: 2016-09-05 | Discharge: 2016-09-05 | Disposition: A | Discharge status: Home / Self Care | Payer: PPO | Source: Ambulatory Visit | Attending: Family Medicine | Admitting: Family Medicine

## 2016-09-05 ENCOUNTER — Other Ambulatory Visit: Payer: Self-pay | Admitting: Family Medicine

## 2016-09-05 DIAGNOSIS — M130 Polyarthritis, unspecified: Secondary | ICD-10-CM

## 2016-09-05 DIAGNOSIS — M1611 Unilateral primary osteoarthritis, right hip: Secondary | ICD-10-CM | POA: Diagnosis not present

## 2016-09-20 DIAGNOSIS — M7061 Trochanteric bursitis, right hip: Secondary | ICD-10-CM | POA: Diagnosis not present

## 2016-09-20 DIAGNOSIS — M545 Low back pain: Secondary | ICD-10-CM | POA: Diagnosis not present

## 2016-10-01 ENCOUNTER — Ambulatory Visit
Admission: RE | Admit: 2016-10-01 | Discharge: 2016-10-01 | Disposition: A | Discharge status: Home / Self Care | Payer: PPO | Source: Ambulatory Visit | Attending: Family Medicine | Admitting: Family Medicine

## 2016-10-01 DIAGNOSIS — Z1231 Encounter for screening mammogram for malignant neoplasm of breast: Secondary | ICD-10-CM | POA: Diagnosis not present

## 2016-12-18 DIAGNOSIS — I10 Essential (primary) hypertension: Secondary | ICD-10-CM | POA: Diagnosis not present

## 2016-12-18 DIAGNOSIS — M130 Polyarthritis, unspecified: Secondary | ICD-10-CM | POA: Diagnosis not present

## 2016-12-18 DIAGNOSIS — Z23 Encounter for immunization: Secondary | ICD-10-CM | POA: Diagnosis not present

## 2016-12-18 DIAGNOSIS — D649 Anemia, unspecified: Secondary | ICD-10-CM | POA: Diagnosis not present

## 2016-12-18 DIAGNOSIS — M1000 Idiopathic gout, unspecified site: Secondary | ICD-10-CM | POA: Diagnosis not present

## 2016-12-18 DIAGNOSIS — E0839 Diabetes mellitus due to underlying condition with other diabetic ophthalmic complication: Secondary | ICD-10-CM | POA: Diagnosis not present

## 2016-12-18 DIAGNOSIS — E089 Diabetes mellitus due to underlying condition without complications: Secondary | ICD-10-CM | POA: Diagnosis not present

## 2017-03-19 DIAGNOSIS — I10 Essential (primary) hypertension: Secondary | ICD-10-CM | POA: Diagnosis not present

## 2017-03-19 DIAGNOSIS — M109 Gout, unspecified: Secondary | ICD-10-CM | POA: Diagnosis not present

## 2017-03-19 DIAGNOSIS — M130 Polyarthritis, unspecified: Secondary | ICD-10-CM | POA: Diagnosis not present

## 2017-03-19 DIAGNOSIS — M62838 Other muscle spasm: Secondary | ICD-10-CM | POA: Diagnosis not present

## 2017-03-19 DIAGNOSIS — E089 Diabetes mellitus due to underlying condition without complications: Secondary | ICD-10-CM | POA: Diagnosis not present

## 2017-03-19 DIAGNOSIS — M1000 Idiopathic gout, unspecified site: Secondary | ICD-10-CM | POA: Diagnosis not present

## 2017-04-22 DIAGNOSIS — M62838 Other muscle spasm: Secondary | ICD-10-CM | POA: Diagnosis not present

## 2017-04-22 DIAGNOSIS — I10 Essential (primary) hypertension: Secondary | ICD-10-CM | POA: Diagnosis not present

## 2017-08-22 DIAGNOSIS — E782 Mixed hyperlipidemia: Secondary | ICD-10-CM | POA: Diagnosis not present

## 2017-08-22 DIAGNOSIS — E118 Type 2 diabetes mellitus with unspecified complications: Secondary | ICD-10-CM | POA: Diagnosis not present

## 2017-08-22 DIAGNOSIS — I10 Essential (primary) hypertension: Secondary | ICD-10-CM | POA: Diagnosis not present

## 2017-08-22 DIAGNOSIS — Z6835 Body mass index (BMI) 35.0-35.9, adult: Secondary | ICD-10-CM | POA: Diagnosis not present

## 2017-08-27 ENCOUNTER — Other Ambulatory Visit: Payer: Self-pay | Admitting: Family Medicine

## 2017-08-27 ENCOUNTER — Ambulatory Visit
Admission: RE | Admit: 2017-08-27 | Discharge: 2017-08-27 | Disposition: A | Discharge status: Home / Self Care | Payer: Medicare HMO | Source: Ambulatory Visit | Attending: Family Medicine | Admitting: Family Medicine

## 2017-08-27 DIAGNOSIS — M7989 Other specified soft tissue disorders: Secondary | ICD-10-CM | POA: Diagnosis not present

## 2017-08-27 DIAGNOSIS — R609 Edema, unspecified: Secondary | ICD-10-CM

## 2017-08-27 DIAGNOSIS — M25571 Pain in right ankle and joints of right foot: Secondary | ICD-10-CM | POA: Diagnosis not present

## 2017-09-03 DIAGNOSIS — Z803 Family history of malignant neoplasm of breast: Secondary | ICD-10-CM | POA: Diagnosis not present

## 2017-09-03 DIAGNOSIS — I509 Heart failure, unspecified: Secondary | ICD-10-CM | POA: Diagnosis not present

## 2017-09-03 DIAGNOSIS — I110 Hypertensive heart disease with heart failure: Secondary | ICD-10-CM | POA: Diagnosis not present

## 2017-09-03 DIAGNOSIS — E119 Type 2 diabetes mellitus without complications: Secondary | ICD-10-CM | POA: Diagnosis not present

## 2017-09-03 DIAGNOSIS — Z833 Family history of diabetes mellitus: Secondary | ICD-10-CM | POA: Diagnosis not present

## 2017-09-03 DIAGNOSIS — Z7984 Long term (current) use of oral hypoglycemic drugs: Secondary | ICD-10-CM | POA: Diagnosis not present

## 2017-09-03 DIAGNOSIS — E785 Hyperlipidemia, unspecified: Secondary | ICD-10-CM | POA: Diagnosis not present

## 2017-09-03 DIAGNOSIS — E669 Obesity, unspecified: Secondary | ICD-10-CM | POA: Diagnosis not present

## 2017-09-03 DIAGNOSIS — Z6836 Body mass index (BMI) 36.0-36.9, adult: Secondary | ICD-10-CM | POA: Diagnosis not present

## 2017-09-03 DIAGNOSIS — Z8249 Family history of ischemic heart disease and other diseases of the circulatory system: Secondary | ICD-10-CM | POA: Diagnosis not present

## 2017-09-12 ENCOUNTER — Other Ambulatory Visit: Payer: Self-pay | Admitting: Family Medicine

## 2017-09-12 DIAGNOSIS — Z1231 Encounter for screening mammogram for malignant neoplasm of breast: Secondary | ICD-10-CM

## 2017-09-17 DIAGNOSIS — E118 Type 2 diabetes mellitus with unspecified complications: Secondary | ICD-10-CM | POA: Diagnosis not present

## 2017-09-17 DIAGNOSIS — D649 Anemia, unspecified: Secondary | ICD-10-CM | POA: Diagnosis not present

## 2017-09-17 DIAGNOSIS — I10 Essential (primary) hypertension: Secondary | ICD-10-CM | POA: Diagnosis not present

## 2017-09-17 DIAGNOSIS — E782 Mixed hyperlipidemia: Secondary | ICD-10-CM | POA: Diagnosis not present

## 2017-09-17 DIAGNOSIS — M130 Polyarthritis, unspecified: Secondary | ICD-10-CM | POA: Diagnosis not present

## 2017-09-19 DIAGNOSIS — M130 Polyarthritis, unspecified: Secondary | ICD-10-CM | POA: Diagnosis not present

## 2017-09-19 DIAGNOSIS — I10 Essential (primary) hypertension: Secondary | ICD-10-CM | POA: Diagnosis not present

## 2017-09-19 DIAGNOSIS — M10071 Idiopathic gout, right ankle and foot: Secondary | ICD-10-CM | POA: Diagnosis not present

## 2017-09-19 DIAGNOSIS — E118 Type 2 diabetes mellitus with unspecified complications: Secondary | ICD-10-CM | POA: Diagnosis not present

## 2017-10-09 ENCOUNTER — Ambulatory Visit
Admission: RE | Admit: 2017-10-09 | Discharge: 2017-10-09 | Disposition: A | Discharge status: Home / Self Care | Payer: Medicare HMO | Source: Ambulatory Visit | Attending: Family Medicine | Admitting: Family Medicine

## 2017-10-09 DIAGNOSIS — Z1231 Encounter for screening mammogram for malignant neoplasm of breast: Secondary | ICD-10-CM | POA: Diagnosis not present

## 2017-11-18 DIAGNOSIS — Z6835 Body mass index (BMI) 35.0-35.9, adult: Secondary | ICD-10-CM | POA: Diagnosis not present

## 2017-11-18 DIAGNOSIS — I10 Essential (primary) hypertension: Secondary | ICD-10-CM | POA: Diagnosis not present

## 2017-11-18 DIAGNOSIS — E118 Type 2 diabetes mellitus with unspecified complications: Secondary | ICD-10-CM | POA: Diagnosis not present

## 2017-11-18 DIAGNOSIS — E782 Mixed hyperlipidemia: Secondary | ICD-10-CM | POA: Diagnosis not present

## 2018-02-27 DIAGNOSIS — E119 Type 2 diabetes mellitus without complications: Secondary | ICD-10-CM | POA: Diagnosis not present

## 2018-02-27 DIAGNOSIS — I110 Hypertensive heart disease with heart failure: Secondary | ICD-10-CM | POA: Diagnosis not present

## 2018-02-27 DIAGNOSIS — Z833 Family history of diabetes mellitus: Secondary | ICD-10-CM | POA: Diagnosis not present

## 2018-02-27 DIAGNOSIS — Z809 Family history of malignant neoplasm, unspecified: Secondary | ICD-10-CM | POA: Diagnosis not present

## 2018-02-27 DIAGNOSIS — Z8249 Family history of ischemic heart disease and other diseases of the circulatory system: Secondary | ICD-10-CM | POA: Diagnosis not present

## 2018-02-27 DIAGNOSIS — Z7984 Long term (current) use of oral hypoglycemic drugs: Secondary | ICD-10-CM | POA: Diagnosis not present

## 2018-02-27 DIAGNOSIS — Z6836 Body mass index (BMI) 36.0-36.9, adult: Secondary | ICD-10-CM | POA: Diagnosis not present

## 2018-02-27 DIAGNOSIS — E785 Hyperlipidemia, unspecified: Secondary | ICD-10-CM | POA: Diagnosis not present

## 2018-02-27 DIAGNOSIS — I509 Heart failure, unspecified: Secondary | ICD-10-CM | POA: Diagnosis not present

## 2018-03-03 DIAGNOSIS — H2511 Age-related nuclear cataract, right eye: Secondary | ICD-10-CM | POA: Diagnosis not present

## 2018-03-03 DIAGNOSIS — E119 Type 2 diabetes mellitus without complications: Secondary | ICD-10-CM | POA: Diagnosis not present

## 2018-03-03 DIAGNOSIS — H2512 Age-related nuclear cataract, left eye: Secondary | ICD-10-CM | POA: Diagnosis not present

## 2018-03-03 DIAGNOSIS — H35372 Puckering of macula, left eye: Secondary | ICD-10-CM | POA: Diagnosis not present

## 2018-03-24 DIAGNOSIS — I10 Essential (primary) hypertension: Secondary | ICD-10-CM | POA: Diagnosis not present

## 2018-03-24 DIAGNOSIS — E118 Type 2 diabetes mellitus with unspecified complications: Secondary | ICD-10-CM | POA: Diagnosis not present

## 2018-03-24 DIAGNOSIS — Z6834 Body mass index (BMI) 34.0-34.9, adult: Secondary | ICD-10-CM | POA: Diagnosis not present

## 2018-03-24 DIAGNOSIS — E782 Mixed hyperlipidemia: Secondary | ICD-10-CM | POA: Diagnosis not present

## 2018-03-25 DIAGNOSIS — E118 Type 2 diabetes mellitus with unspecified complications: Secondary | ICD-10-CM | POA: Diagnosis not present

## 2018-03-25 DIAGNOSIS — H35372 Puckering of macula, left eye: Secondary | ICD-10-CM | POA: Diagnosis not present

## 2018-03-25 DIAGNOSIS — H25813 Combined forms of age-related cataract, bilateral: Secondary | ICD-10-CM | POA: Diagnosis not present

## 2018-03-25 DIAGNOSIS — E782 Mixed hyperlipidemia: Secondary | ICD-10-CM | POA: Diagnosis not present

## 2018-03-25 DIAGNOSIS — I10 Essential (primary) hypertension: Secondary | ICD-10-CM | POA: Diagnosis not present

## 2018-03-25 DIAGNOSIS — E119 Type 2 diabetes mellitus without complications: Secondary | ICD-10-CM | POA: Diagnosis not present

## 2018-07-16 DIAGNOSIS — H25812 Combined forms of age-related cataract, left eye: Secondary | ICD-10-CM | POA: Diagnosis not present

## 2018-07-22 DIAGNOSIS — E1169 Type 2 diabetes mellitus with other specified complication: Secondary | ICD-10-CM | POA: Diagnosis not present

## 2018-07-22 DIAGNOSIS — D649 Anemia, unspecified: Secondary | ICD-10-CM | POA: Diagnosis not present

## 2018-07-22 DIAGNOSIS — E782 Mixed hyperlipidemia: Secondary | ICD-10-CM | POA: Diagnosis not present

## 2018-07-22 DIAGNOSIS — I10 Essential (primary) hypertension: Secondary | ICD-10-CM | POA: Diagnosis not present

## 2018-07-24 DIAGNOSIS — M1000 Idiopathic gout, unspecified site: Secondary | ICD-10-CM | POA: Diagnosis not present

## 2018-07-24 DIAGNOSIS — E0839 Diabetes mellitus due to underlying condition with other diabetic ophthalmic complication: Secondary | ICD-10-CM | POA: Diagnosis not present

## 2018-07-24 DIAGNOSIS — I10 Essential (primary) hypertension: Secondary | ICD-10-CM | POA: Diagnosis not present

## 2018-07-24 DIAGNOSIS — M130 Polyarthritis, unspecified: Secondary | ICD-10-CM | POA: Diagnosis not present

## 2018-07-24 DIAGNOSIS — M10071 Idiopathic gout, right ankle and foot: Secondary | ICD-10-CM | POA: Diagnosis not present

## 2018-07-24 DIAGNOSIS — E782 Mixed hyperlipidemia: Secondary | ICD-10-CM | POA: Diagnosis not present

## 2018-07-24 DIAGNOSIS — E118 Type 2 diabetes mellitus with unspecified complications: Secondary | ICD-10-CM | POA: Diagnosis not present

## 2018-07-24 DIAGNOSIS — E1169 Type 2 diabetes mellitus with other specified complication: Secondary | ICD-10-CM | POA: Diagnosis not present

## 2018-07-24 DIAGNOSIS — D649 Anemia, unspecified: Secondary | ICD-10-CM | POA: Diagnosis not present

## 2018-07-24 DIAGNOSIS — E089 Diabetes mellitus due to underlying condition without complications: Secondary | ICD-10-CM | POA: Diagnosis not present

## 2018-07-28 DIAGNOSIS — Z961 Presence of intraocular lens: Secondary | ICD-10-CM | POA: Diagnosis not present

## 2018-07-28 DIAGNOSIS — H35372 Puckering of macula, left eye: Secondary | ICD-10-CM | POA: Diagnosis not present

## 2018-07-28 DIAGNOSIS — E119 Type 2 diabetes mellitus without complications: Secondary | ICD-10-CM | POA: Diagnosis not present

## 2018-07-28 DIAGNOSIS — H2511 Age-related nuclear cataract, right eye: Secondary | ICD-10-CM | POA: Diagnosis not present

## 2018-08-25 DIAGNOSIS — I10 Essential (primary) hypertension: Secondary | ICD-10-CM | POA: Diagnosis not present

## 2018-08-25 DIAGNOSIS — E1169 Type 2 diabetes mellitus with other specified complication: Secondary | ICD-10-CM | POA: Diagnosis not present

## 2018-08-25 DIAGNOSIS — D649 Anemia, unspecified: Secondary | ICD-10-CM | POA: Diagnosis not present

## 2018-08-27 DIAGNOSIS — H35372 Puckering of macula, left eye: Secondary | ICD-10-CM | POA: Diagnosis not present

## 2018-08-27 DIAGNOSIS — H43822 Vitreomacular adhesion, left eye: Secondary | ICD-10-CM | POA: Diagnosis not present

## 2018-09-01 ENCOUNTER — Other Ambulatory Visit: Payer: Self-pay | Admitting: Family Medicine

## 2018-09-01 DIAGNOSIS — Z1231 Encounter for screening mammogram for malignant neoplasm of breast: Secondary | ICD-10-CM

## 2018-09-04 DIAGNOSIS — H35372 Puckering of macula, left eye: Secondary | ICD-10-CM | POA: Diagnosis not present

## 2018-09-04 DIAGNOSIS — Z09 Encounter for follow-up examination after completed treatment for conditions other than malignant neoplasm: Secondary | ICD-10-CM | POA: Diagnosis not present

## 2018-10-20 ENCOUNTER — Other Ambulatory Visit: Payer: Self-pay

## 2018-10-20 ENCOUNTER — Ambulatory Visit
Admission: RE | Admit: 2018-10-20 | Discharge: 2018-10-20 | Disposition: A | Discharge status: Home / Self Care | Payer: Medicare HMO | Source: Ambulatory Visit | Attending: Family Medicine | Admitting: Family Medicine

## 2018-10-20 DIAGNOSIS — Z1231 Encounter for screening mammogram for malignant neoplasm of breast: Secondary | ICD-10-CM

## 2018-11-25 DIAGNOSIS — E1169 Type 2 diabetes mellitus with other specified complication: Secondary | ICD-10-CM | POA: Diagnosis not present

## 2018-11-25 DIAGNOSIS — I10 Essential (primary) hypertension: Secondary | ICD-10-CM | POA: Diagnosis not present

## 2018-11-25 DIAGNOSIS — Z0000 Encounter for general adult medical examination without abnormal findings: Secondary | ICD-10-CM | POA: Diagnosis not present

## 2018-11-25 DIAGNOSIS — Z23 Encounter for immunization: Secondary | ICD-10-CM | POA: Diagnosis not present

## 2018-11-25 DIAGNOSIS — E782 Mixed hyperlipidemia: Secondary | ICD-10-CM | POA: Diagnosis not present

## 2018-12-04 DIAGNOSIS — H35372 Puckering of macula, left eye: Secondary | ICD-10-CM | POA: Diagnosis not present

## 2018-12-04 DIAGNOSIS — E119 Type 2 diabetes mellitus without complications: Secondary | ICD-10-CM | POA: Diagnosis not present

## 2018-12-04 DIAGNOSIS — H2511 Age-related nuclear cataract, right eye: Secondary | ICD-10-CM | POA: Diagnosis not present

## 2019-01-11 DIAGNOSIS — R69 Illness, unspecified: Secondary | ICD-10-CM | POA: Diagnosis not present

## 2019-02-24 DIAGNOSIS — H35372 Puckering of macula, left eye: Secondary | ICD-10-CM | POA: Diagnosis not present

## 2019-02-24 DIAGNOSIS — H25811 Combined forms of age-related cataract, right eye: Secondary | ICD-10-CM | POA: Diagnosis not present

## 2019-02-24 DIAGNOSIS — Z961 Presence of intraocular lens: Secondary | ICD-10-CM | POA: Diagnosis not present

## 2019-02-25 ENCOUNTER — Ambulatory Visit: Payer: Medicare Other | Attending: Internal Medicine

## 2019-02-25 DIAGNOSIS — Z23 Encounter for immunization: Secondary | ICD-10-CM | POA: Insufficient documentation

## 2019-02-25 NOTE — Progress Notes (Signed)
   Covid-19 Vaccination Clinic  Name:  Terri Mcconnell    MRN: [id number] DOB: 27-Jul-1945  02/25/2019  [name] was observed post Covid-19 immunization for 15 minutes without incidence. She was provided with Vaccine Information Sheet and instruction to access the V-Safe system.   [name] was instructed to call 911 with any severe reactions post vaccine: Marland Kitchen Difficulty breathing  . Swelling of your face and throat  . A fast heartbeat  . A bad rash all over your body  . Dizziness and weakness    Immunizations Administered    Name Date Dose VIS Date Route   Pfizer COVID-19 Vaccine 02/25/2019  6:08 PM 0.3 mL 01/16/2019 Intramuscular   Manufacturer: [employer], [employer]   Lot: [id number]   NDC: [address]-6

## 2019-03-19 ENCOUNTER — Ambulatory Visit: Payer: Medicare HMO | Attending: Internal Medicine

## 2019-03-19 DIAGNOSIS — Z23 Encounter for immunization: Secondary | ICD-10-CM

## 2019-03-19 NOTE — Progress Notes (Signed)
   Covid-19 Vaccination Clinic  Name:  JEZABELLE CHISOLM    MRN: [id number] DOB: 28-Jan-1946  03/19/2019  Ms. Stigall was observed post Covid-19 immunization for 15 minutes without incidence. She was provided with Vaccine Information Sheet and instruction to access the V-Safe system.   Ms. Bossi was instructed to call 911 with any severe reactions post vaccine: Marland Kitchen Difficulty breathing  . Swelling of your face and throat  . A fast heartbeat  . A bad rash all over your body  . Dizziness and weakness    Immunizations Administered    Name Date Dose VIS Date Route   Pfizer COVID-19 Vaccine 03/19/2019  8:47 AM 0.3 mL 01/16/2019 Intramuscular   Manufacturer: [employer], [employer]   Lot: [id number]   NDC: [address]-5

## 2019-03-27 DIAGNOSIS — E118 Type 2 diabetes mellitus with unspecified complications: Secondary | ICD-10-CM | POA: Diagnosis not present

## 2019-03-27 DIAGNOSIS — R799 Abnormal finding of blood chemistry, unspecified: Secondary | ICD-10-CM | POA: Diagnosis not present

## 2019-03-27 DIAGNOSIS — E782 Mixed hyperlipidemia: Secondary | ICD-10-CM | POA: Diagnosis not present

## 2019-03-27 DIAGNOSIS — D649 Anemia, unspecified: Secondary | ICD-10-CM | POA: Diagnosis not present

## 2019-03-27 DIAGNOSIS — I10 Essential (primary) hypertension: Secondary | ICD-10-CM | POA: Diagnosis not present

## 2019-03-27 DIAGNOSIS — E1169 Type 2 diabetes mellitus with other specified complication: Secondary | ICD-10-CM | POA: Diagnosis not present

## 2019-03-27 DIAGNOSIS — R7309 Other abnormal glucose: Secondary | ICD-10-CM | POA: Diagnosis not present

## 2019-03-31 DIAGNOSIS — D649 Anemia, unspecified: Secondary | ICD-10-CM | POA: Diagnosis not present

## 2019-03-31 DIAGNOSIS — M130 Polyarthritis, unspecified: Secondary | ICD-10-CM | POA: Diagnosis not present

## 2019-03-31 DIAGNOSIS — E1169 Type 2 diabetes mellitus with other specified complication: Secondary | ICD-10-CM | POA: Diagnosis not present

## 2019-03-31 DIAGNOSIS — I10 Essential (primary) hypertension: Secondary | ICD-10-CM | POA: Diagnosis not present

## 2019-04-07 DIAGNOSIS — Z7984 Long term (current) use of oral hypoglycemic drugs: Secondary | ICD-10-CM | POA: Diagnosis not present

## 2019-04-07 DIAGNOSIS — R69 Illness, unspecified: Secondary | ICD-10-CM | POA: Diagnosis not present

## 2019-04-07 DIAGNOSIS — E785 Hyperlipidemia, unspecified: Secondary | ICD-10-CM | POA: Diagnosis not present

## 2019-04-07 DIAGNOSIS — E1136 Type 2 diabetes mellitus with diabetic cataract: Secondary | ICD-10-CM | POA: Diagnosis not present

## 2019-04-07 DIAGNOSIS — Z823 Family history of stroke: Secondary | ICD-10-CM | POA: Diagnosis not present

## 2019-04-07 DIAGNOSIS — E669 Obesity, unspecified: Secondary | ICD-10-CM | POA: Diagnosis not present

## 2019-04-07 DIAGNOSIS — Z803 Family history of malignant neoplasm of breast: Secondary | ICD-10-CM | POA: Diagnosis not present

## 2019-04-07 DIAGNOSIS — G8929 Other chronic pain: Secondary | ICD-10-CM | POA: Diagnosis not present

## 2019-04-07 DIAGNOSIS — I10 Essential (primary) hypertension: Secondary | ICD-10-CM | POA: Diagnosis not present

## 2019-04-28 DIAGNOSIS — L309 Dermatitis, unspecified: Secondary | ICD-10-CM | POA: Diagnosis not present

## 2019-04-28 DIAGNOSIS — D649 Anemia, unspecified: Secondary | ICD-10-CM | POA: Diagnosis not present

## 2019-04-28 DIAGNOSIS — M130 Polyarthritis, unspecified: Secondary | ICD-10-CM | POA: Diagnosis not present

## 2019-04-28 DIAGNOSIS — I10 Essential (primary) hypertension: Secondary | ICD-10-CM | POA: Diagnosis not present

## 2019-07-30 ENCOUNTER — Other Ambulatory Visit: Payer: Self-pay | Admitting: Family Medicine

## 2019-07-30 ENCOUNTER — Ambulatory Visit
Admission: RE | Admit: 2019-07-30 | Discharge: 2019-07-30 | Disposition: A | Discharge status: Home / Self Care | Payer: Medicare HMO | Source: Ambulatory Visit | Attending: Family Medicine | Admitting: Family Medicine

## 2019-07-30 DIAGNOSIS — M130 Polyarthritis, unspecified: Secondary | ICD-10-CM

## 2019-07-30 DIAGNOSIS — M10071 Idiopathic gout, right ankle and foot: Secondary | ICD-10-CM | POA: Diagnosis not present

## 2019-07-30 DIAGNOSIS — R2689 Other abnormalities of gait and mobility: Secondary | ICD-10-CM

## 2019-07-30 DIAGNOSIS — M25561 Pain in right knee: Secondary | ICD-10-CM | POA: Diagnosis not present

## 2019-07-30 DIAGNOSIS — M1712 Unilateral primary osteoarthritis, left knee: Secondary | ICD-10-CM | POA: Diagnosis not present

## 2019-07-30 DIAGNOSIS — I10 Essential (primary) hypertension: Secondary | ICD-10-CM | POA: Diagnosis not present

## 2019-07-30 DIAGNOSIS — D649 Anemia, unspecified: Secondary | ICD-10-CM | POA: Diagnosis not present

## 2019-07-30 DIAGNOSIS — E1169 Type 2 diabetes mellitus with other specified complication: Secondary | ICD-10-CM | POA: Diagnosis not present

## 2019-08-20 DIAGNOSIS — M130 Polyarthritis, unspecified: Secondary | ICD-10-CM | POA: Diagnosis not present

## 2019-08-24 DIAGNOSIS — Z961 Presence of intraocular lens: Secondary | ICD-10-CM | POA: Diagnosis not present

## 2019-08-24 DIAGNOSIS — H35372 Puckering of macula, left eye: Secondary | ICD-10-CM | POA: Diagnosis not present

## 2019-08-24 DIAGNOSIS — H26492 Other secondary cataract, left eye: Secondary | ICD-10-CM | POA: Diagnosis not present

## 2019-08-24 DIAGNOSIS — E119 Type 2 diabetes mellitus without complications: Secondary | ICD-10-CM | POA: Diagnosis not present

## 2019-08-24 DIAGNOSIS — H25811 Combined forms of age-related cataract, right eye: Secondary | ICD-10-CM | POA: Diagnosis not present

## 2019-09-21 DIAGNOSIS — E1169 Type 2 diabetes mellitus with other specified complication: Secondary | ICD-10-CM | POA: Diagnosis not present

## 2019-09-21 DIAGNOSIS — I10 Essential (primary) hypertension: Secondary | ICD-10-CM | POA: Diagnosis not present

## 2019-09-21 DIAGNOSIS — M130 Polyarthritis, unspecified: Secondary | ICD-10-CM | POA: Diagnosis not present

## 2019-09-28 DIAGNOSIS — M172 Bilateral post-traumatic osteoarthritis of knee: Secondary | ICD-10-CM | POA: Diagnosis not present

## 2019-10-07 ENCOUNTER — Other Ambulatory Visit: Payer: Self-pay | Admitting: Family Medicine

## 2019-10-07 DIAGNOSIS — Z1231 Encounter for screening mammogram for malignant neoplasm of breast: Secondary | ICD-10-CM

## 2019-10-26 ENCOUNTER — Ambulatory Visit
Admission: RE | Admit: 2019-10-26 | Discharge: 2019-10-26 | Disposition: A | Discharge status: Home / Self Care | Payer: Medicare HMO | Source: Ambulatory Visit | Attending: Family Medicine | Admitting: Family Medicine

## 2019-10-26 ENCOUNTER — Other Ambulatory Visit: Payer: Self-pay

## 2019-10-26 DIAGNOSIS — Z1231 Encounter for screening mammogram for malignant neoplasm of breast: Secondary | ICD-10-CM | POA: Diagnosis not present

## 2019-11-26 DIAGNOSIS — R69 Illness, unspecified: Secondary | ICD-10-CM | POA: Diagnosis not present

## 2019-12-05 DIAGNOSIS — E785 Hyperlipidemia, unspecified: Secondary | ICD-10-CM | POA: Diagnosis not present

## 2019-12-05 DIAGNOSIS — M130 Polyarthritis, unspecified: Secondary | ICD-10-CM | POA: Diagnosis not present

## 2019-12-05 DIAGNOSIS — I10 Essential (primary) hypertension: Secondary | ICD-10-CM | POA: Diagnosis not present

## 2019-12-05 DIAGNOSIS — E1169 Type 2 diabetes mellitus with other specified complication: Secondary | ICD-10-CM | POA: Diagnosis not present

## 2019-12-09 DIAGNOSIS — H35372 Puckering of macula, left eye: Secondary | ICD-10-CM | POA: Insufficient documentation

## 2019-12-09 DIAGNOSIS — H2511 Age-related nuclear cataract, right eye: Secondary | ICD-10-CM | POA: Insufficient documentation

## 2019-12-09 DIAGNOSIS — Z961 Presence of intraocular lens: Secondary | ICD-10-CM | POA: Insufficient documentation

## 2019-12-09 DIAGNOSIS — E119 Type 2 diabetes mellitus without complications: Secondary | ICD-10-CM | POA: Insufficient documentation

## 2019-12-09 DIAGNOSIS — H43822 Vitreomacular adhesion, left eye: Secondary | ICD-10-CM | POA: Insufficient documentation

## 2019-12-10 ENCOUNTER — Ambulatory Visit (INDEPENDENT_AMBULATORY_CARE_PROVIDER_SITE_OTHER): Payer: Medicare HMO | Admitting: Ophthalmology

## 2019-12-10 ENCOUNTER — Encounter (INDEPENDENT_AMBULATORY_CARE_PROVIDER_SITE_OTHER): Payer: Self-pay | Admitting: Ophthalmology

## 2019-12-10 ENCOUNTER — Other Ambulatory Visit: Payer: Self-pay

## 2019-12-10 DIAGNOSIS — E119 Type 2 diabetes mellitus without complications: Secondary | ICD-10-CM | POA: Diagnosis not present

## 2019-12-10 DIAGNOSIS — H43822 Vitreomacular adhesion, left eye: Secondary | ICD-10-CM | POA: Diagnosis not present

## 2019-12-10 DIAGNOSIS — H35372 Puckering of macula, left eye: Secondary | ICD-10-CM

## 2019-12-10 DIAGNOSIS — H2511 Age-related nuclear cataract, right eye: Secondary | ICD-10-CM | POA: Diagnosis not present

## 2019-12-10 DIAGNOSIS — Z961 Presence of intraocular lens: Secondary | ICD-10-CM

## 2019-12-10 NOTE — Assessment & Plan Note (Signed)
Progressive cataract in the right eye, cataract surgery can be undertaken at any time for choosing with her general eye doctorThe nature of cataract was discussed with the patient as well as the elective nature of surgery. The patient was reassured that surgery at a later date does not put the patient at risk for a worse outcome. It was emphasized that the need for surgery is dictated by the patient's quality of life as influenced by the cataract. Patient was instructed to maintain close follow up with their general eye care doctor.

## 2019-12-10 NOTE — Progress Notes (Signed)
12/10/2019     CHIEF COMPLAINT Patient presents for Retina Follow Up   HISTORY OF PRESENT ILLNESS: Terri Mcconnell is a 74 y.o. female who presents to the clinic today for:   HPI    Retina Follow Up    Patient presents with  Diabetic Retinopathy.  In both eyes.  This started 1 year ago.  Severity is mild.  Duration of 1 year.  Since onset it is stable.          Comments    1 Year Diabetic F/U OU  Pt denies noticeable changes to [place] OU since last visit. Pt denies ocular pain, flashes of light, or floaters OU.  A1c: 7.0, 09/2019 LBS: 99 this AM       Last edited by Ileana Roup, COA on 12/10/2019  8:24 AM. (History)      Referring physician: Renaye Rakers, MD [address] [place],  [place] [address]  HISTORICAL INFORMATION:   Selected notes from the MEDICAL RECORD NUMBER    Lab Results  Component Value Date   HGBA1C 6.3 (H) 10/22/2010     CURRENT MEDICATIONS: No current outpatient medications on file. (Ophthalmic Drugs)   No current facility-administered medications for this visit. (Ophthalmic Drugs)   Current Outpatient Medications (Other)  Medication Sig   amLODipine-valsartan (EXFORGE) 5-160 MG tablet Take 1 tablet by mouth daily.   cephALEXin (KEFLEX) 500 MG capsule Take 1 capsule (500 mg total) by mouth 4 (four) times daily. (Patient not taking: Reported on 06/03/2016)   ferrous sulfate 325 (65 FE) MG tablet Take 325 mg by mouth daily with breakfast.   meclizine (ANTIVERT) 25 MG tablet Take 1 tablet (25 mg total) by mouth 4 (four) times daily. (Patient not taking: Reported on 06/03/2016)   metFORMIN (GLUCOPHAGE) 500 MG tablet Take 500 mg by mouth daily.   metoprolol tartrate (LOPRESSOR) 25 MG tablet Take 0.5 tablets (12.5 mg total) by mouth 2 (two) times daily.   potassium chloride SA (K-DUR,KLOR-CON) 20 MEQ tablet Take 20 mEq by mouth daily.   rosuvastatin (CRESTOR) 10 MG tablet Take 10 mg by mouth daily.   No current facility-administered  medications for this visit. (Other)      REVIEW OF SYSTEMS:    ALLERGIES No Known Allergies  PAST MEDICAL HISTORY Past Medical History:  Diagnosis Date   Diabetes mellitus without complication (HCC)    Hypertension    Past Surgical History:  Procedure Laterality Date   CHOLECYSTECTOMY      FAMILY HISTORY Family History  Problem Relation Age of Onset   Breast cancer Sister 8    SOCIAL HISTORY Social History   Tobacco Use   Smoking status: Never Smoker   Smokeless tobacco: Current User    Types: Snuff  Substance Use Topics   Alcohol use: Yes   Drug use: No         OPHTHALMIC EXAM:  Base Eye Exam    Visual Acuity (ETDRS)      Right Left   Dist [place] 20/30 -2 20/30 -2   Dist ph [place] NI NI       Tonometry (Tonopen, 8:24 AM)      Right Left   Pressure 14 15       Pupils      Pupils Dark Light Shape React APD   Right PERRL 4 3 Round Brisk None   Left PERRL 4 3 Round Brisk None       Visual Fields (Counting fingers)  Left Right    Full Full       Extraocular Movement      Right Left    Full Full       Neuro/Psych    Oriented x3: Yes   Mood/Affect: Normal       Dilation    Both eyes: 1.0% Mydriacyl, 2.5% Phenylephrine @ 8:27 AM        Slit Lamp and Fundus Exam    External Exam      Right Left   External Normal Normal       Slit Lamp Exam      Right Left   Lids/Lashes Normal Normal   Conjunctiva/Sclera White and quiet White and quiet   Cornea Clear Clear   Anterior Chamber Deep and quiet Deep and quiet   Iris Round and reactive Round and reactive   Lens 3+ Nuclear sclerosis Centered posterior chamber intraocular lens   Anterior Vitreous Normal Normal       Fundus Exam      Right Left   Posterior Vitreous Normal Clear vitrectomized   Disc Normal Normal   C/D Ratio 0.5 0.65   Macula Normal Normal   Vessels no DR no DR   Periphery Normal Normal          IMAGING AND PROCEDURES  Imaging and Procedures for  12/10/19  OCT, Retina - OU - Both Eyes       Right Eye Quality was good. Scan locations included subfoveal. Central Foveal Thickness: 233. Progression has been stable. Findings include normal observations, normal foveal contour.   Left Eye Quality was good. Central Foveal Thickness: 304. Progression has improved. Findings include abnormal foveal contour.   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  Vastly improved anatomy.  Stable acuity.  Observe       Color Fundus Photography Optos - OU - Both Eyes       Right Eye Progression has been stable. Disc findings include normal observations. Macula : normal observations. Vessels : normal observations. Periphery : normal observations.   Left Eye Progression has improved. Disc findings include increased cup to disc ratio. Vessels : normal observations. Periphery : normal observations.   Notes No detectable diabetic retinopathy  OS, status post resection of epiretinal membrane, no distortion noted                ASSESSMENT/PLAN:  Nuclear sclerotic cataract of right eye Progressive cataract in the right eye, cataract surgery can be undertaken at any time for choosing with her general eye doctorThe nature of cataract was discussed with the patient as well as the elective nature of surgery. The patient was reassured that surgery at a later date does not put the patient at risk for a worse outcome. It was emphasized that the need for surgery is dictated by the patient's quality of life as influenced by the cataract. Patient was instructed to maintain close follow up with their general eye care doctor.  Non-insulin dependent type 2 diabetes mellitus (HCC) No detectable diabetic retinopathy right eye      ICD-10-CM   1. Left epiretinal membrane  H35.372  OCT, Retina - OU - Both Eyes    Color Fundus Photography Optos - OU - Both Eyes  2. Non-insulin dependent type 2 diabetes mellitus (HCC)  E11.9 OCT, Retina - OU - Both  Eyes    Color Fundus Photography Optos - OU - Both Eyes  3. Nuclear sclerotic cataract of right eye  H25.11   4. Pseudophakia of left eye  Z96.1   5. 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  Patient has the option to return to Dr. Alden Hipp at any time for consideration of cataract surgery in the right eye, based solely upon what symptoms that she has regarding the darkening of the vision in that eye with the dense nuclear sclerosis  2.  Epiretinal membrane left eye now resolved as is the vitreal macular traction status post vitrectomy July 2020  3.  No detectable diabetic retinopathy, continued blood sugar control monitoring is critical  Ophthalmic Meds Ordered this visit:  No orders of the defined types were placed in this encounter.      Return in about 1 year (around 12/09/2020) for DILATE OU, OCT.  There are no Patient Instructions on file for this visit.   Explained the diagnoses, plan, and follow up with the patient and they expressed understanding.  Patient expressed understanding of the importance of proper follow up care.   Alford Highland Makaylyn Sinyard M.D. Diseases & Surgery of the Retina and Vitreous [facility] 12/10/19     Abbreviations: M myopia (nearsighted); A astigmatism; H hyperopia (farsighted); P presbyopia; Mrx spectacle prescription;  CTL contact lenses; OD right eye; OS left eye; OU both eyes  XT exotropia; ET esotropia; PEK punctate epithelial keratitis; PEE punctate epithelial erosions; DES dry eye syndrome; MGD meibomian gland dysfunction; ATs artificial tears; PFAT's preservative free artificial tears; NSC nuclear sclerotic cataract; PSC posterior subcapsular cataract; ERM epi-retinal membrane; PVD  posterior vitreous detachment; RD retinal detachment; DM diabetes mellitus; DR diabetic retinopathy; NPDR non-proliferative diabetic retinopathy; PDR proliferative diabetic retinopathy; CSME clinically significant macular edema; DME diabetic macular edema; dbh dot blot hemorrhages; CWS cotton wool spot; POAG primary open angle glaucoma; C/D cup-to-disc ratio; HVF humphrey visual field; GVF goldmann visual field; OCT optical coherence tomography; IOP intraocular pressure; BRVO Branch retinal vein occlusion; CRVO central retinal vein occlusion; CRAO central retinal artery occlusion; BRAO branch retinal artery occlusion; RT retinal tear; SB scleral buckle; PPV pars plana vitrectomy; VH Vitreous hemorrhage; PRP panretinal laser photocoagulation; IVK intravitreal kenalog; VMT vitreomacular traction; MH Macular hole;  NVD neovascularization of the disc; NVE neovascularization elsewhere; AREDS age related eye disease study; ARMD age related macular degeneration; POAG primary open angle glaucoma; EBMD epithelial/anterior basement membrane dystrophy; ACIOL anterior chamber intraocular lens; IOL intraocular lens; PCIOL posterior chamber intraocular lens; Phaco/IOL phacoemulsification with intraocular lens placement; PRK photorefractive keratectomy; LASIK laser assisted in situ keratomileusis; HTN hypertension; DM diabetes mellitus; COPD chronic obstructive pulmonary disease

## 2019-12-10 NOTE — Assessment & Plan Note (Signed)
No detectable diabetic retinopathy right eye. 

## 2020-02-15 DIAGNOSIS — I10 Essential (primary) hypertension: Secondary | ICD-10-CM | POA: Diagnosis not present

## 2020-02-15 DIAGNOSIS — E1169 Type 2 diabetes mellitus with other specified complication: Secondary | ICD-10-CM | POA: Diagnosis not present

## 2020-02-15 DIAGNOSIS — Z0000 Encounter for general adult medical examination without abnormal findings: Secondary | ICD-10-CM | POA: Diagnosis not present

## 2020-02-15 DIAGNOSIS — E782 Mixed hyperlipidemia: Secondary | ICD-10-CM | POA: Diagnosis not present

## 2020-02-15 DIAGNOSIS — M130 Polyarthritis, unspecified: Secondary | ICD-10-CM | POA: Diagnosis not present

## 2020-03-30 DIAGNOSIS — E669 Obesity, unspecified: Secondary | ICD-10-CM | POA: Diagnosis not present

## 2020-03-30 DIAGNOSIS — Z7984 Long term (current) use of oral hypoglycemic drugs: Secondary | ICD-10-CM | POA: Diagnosis not present

## 2020-03-30 DIAGNOSIS — E785 Hyperlipidemia, unspecified: Secondary | ICD-10-CM | POA: Diagnosis not present

## 2020-03-30 DIAGNOSIS — E11620 Type 2 diabetes mellitus with diabetic dermatitis: Secondary | ICD-10-CM | POA: Diagnosis not present

## 2020-03-30 DIAGNOSIS — Z6836 Body mass index (BMI) 36.0-36.9, adult: Secondary | ICD-10-CM | POA: Diagnosis not present

## 2020-03-30 DIAGNOSIS — Z7982 Long term (current) use of aspirin: Secondary | ICD-10-CM | POA: Diagnosis not present

## 2020-03-30 DIAGNOSIS — Z803 Family history of malignant neoplasm of breast: Secondary | ICD-10-CM | POA: Diagnosis not present

## 2020-03-30 DIAGNOSIS — I10 Essential (primary) hypertension: Secondary | ICD-10-CM | POA: Diagnosis not present

## 2020-03-30 DIAGNOSIS — Z8249 Family history of ischemic heart disease and other diseases of the circulatory system: Secondary | ICD-10-CM | POA: Diagnosis not present

## 2020-03-30 DIAGNOSIS — M1990 Unspecified osteoarthritis, unspecified site: Secondary | ICD-10-CM | POA: Diagnosis not present

## 2020-05-27 DIAGNOSIS — M10071 Idiopathic gout, right ankle and foot: Secondary | ICD-10-CM | POA: Diagnosis not present

## 2020-05-27 DIAGNOSIS — E1169 Type 2 diabetes mellitus with other specified complication: Secondary | ICD-10-CM | POA: Diagnosis not present

## 2020-05-27 DIAGNOSIS — I10 Essential (primary) hypertension: Secondary | ICD-10-CM | POA: Diagnosis not present

## 2020-05-27 DIAGNOSIS — M25571 Pain in right ankle and joints of right foot: Secondary | ICD-10-CM | POA: Diagnosis not present

## 2020-05-27 DIAGNOSIS — E782 Mixed hyperlipidemia: Secondary | ICD-10-CM | POA: Diagnosis not present

## 2020-05-30 ENCOUNTER — Other Ambulatory Visit: Payer: Self-pay | Admitting: Family Medicine

## 2020-05-30 ENCOUNTER — Ambulatory Visit
Admission: RE | Admit: 2020-05-30 | Discharge: 2020-05-30 | Disposition: A | Discharge status: Home / Self Care | Payer: Medicare HMO | Source: Ambulatory Visit | Attending: Family Medicine | Admitting: Family Medicine

## 2020-05-30 DIAGNOSIS — M7731 Calcaneal spur, right foot: Secondary | ICD-10-CM | POA: Diagnosis not present

## 2020-05-30 DIAGNOSIS — R609 Edema, unspecified: Secondary | ICD-10-CM

## 2020-05-30 DIAGNOSIS — E1169 Type 2 diabetes mellitus with other specified complication: Secondary | ICD-10-CM | POA: Diagnosis not present

## 2020-05-30 DIAGNOSIS — I10 Essential (primary) hypertension: Secondary | ICD-10-CM | POA: Diagnosis not present

## 2020-05-30 DIAGNOSIS — M25571 Pain in right ankle and joints of right foot: Secondary | ICD-10-CM | POA: Diagnosis not present

## 2020-05-30 DIAGNOSIS — M7989 Other specified soft tissue disorders: Secondary | ICD-10-CM | POA: Diagnosis not present

## 2020-06-14 DIAGNOSIS — Z6834 Body mass index (BMI) 34.0-34.9, adult: Secondary | ICD-10-CM | POA: Diagnosis not present

## 2020-06-14 DIAGNOSIS — E669 Obesity, unspecified: Secondary | ICD-10-CM | POA: Diagnosis not present

## 2020-06-14 DIAGNOSIS — M13879 Other specified arthritis, unspecified ankle and foot: Secondary | ICD-10-CM | POA: Diagnosis not present

## 2020-08-04 DIAGNOSIS — M130 Polyarthritis, unspecified: Secondary | ICD-10-CM | POA: Diagnosis not present

## 2020-08-04 DIAGNOSIS — E782 Mixed hyperlipidemia: Secondary | ICD-10-CM | POA: Diagnosis not present

## 2020-08-04 DIAGNOSIS — I10 Essential (primary) hypertension: Secondary | ICD-10-CM | POA: Diagnosis not present

## 2020-08-04 DIAGNOSIS — E1169 Type 2 diabetes mellitus with other specified complication: Secondary | ICD-10-CM | POA: Diagnosis not present

## 2020-08-20 ENCOUNTER — Other Ambulatory Visit: Payer: Self-pay

## 2020-08-20 ENCOUNTER — Encounter (HOSPITAL_COMMUNITY): Payer: Self-pay | Admitting: *Deleted

## 2020-08-20 ENCOUNTER — Ambulatory Visit (HOSPITAL_COMMUNITY)
Admission: EM | Admit: 2020-08-20 | Discharge: 2020-08-20 | Disposition: A | Discharge status: Home / Self Care | Payer: Medicare HMO | Attending: Physician Assistant | Admitting: Physician Assistant

## 2020-08-20 DIAGNOSIS — R69 Illness, unspecified: Secondary | ICD-10-CM | POA: Diagnosis not present

## 2020-08-20 DIAGNOSIS — Z79899 Other long term (current) drug therapy: Secondary | ICD-10-CM | POA: Insufficient documentation

## 2020-08-20 DIAGNOSIS — R059 Cough, unspecified: Secondary | ICD-10-CM | POA: Diagnosis not present

## 2020-08-20 DIAGNOSIS — Z7984 Long term (current) use of oral hypoglycemic drugs: Secondary | ICD-10-CM | POA: Insufficient documentation

## 2020-08-20 DIAGNOSIS — J069 Acute upper respiratory infection, unspecified: Secondary | ICD-10-CM | POA: Diagnosis not present

## 2020-08-20 DIAGNOSIS — F17290 Nicotine dependence, other tobacco product, uncomplicated: Secondary | ICD-10-CM | POA: Insufficient documentation

## 2020-08-20 DIAGNOSIS — Z20822 Contact with and (suspected) exposure to covid-19: Secondary | ICD-10-CM | POA: Diagnosis not present

## 2020-08-20 LAB — BASIC METABOLIC PANEL
Anion gap: 8 (ref 5–15)
BUN: 13 mg/dL (ref 8–23)
CO2: 24 mmol/L (ref 22–32)
Calcium: 9.1 mg/dL (ref 8.9–10.3)
Chloride: 105 mmol/L (ref 98–111)
Creatinine, Ser: 1.01 mg/dL — ABNORMAL HIGH (ref 0.44–1.00)
GFR, Estimated: 58 mL/min — ABNORMAL LOW (ref >60)
Glucose, Bld: 111 mg/dL — ABNORMAL HIGH (ref 70–99)
Potassium: 4.6 mmol/L (ref 3.5–5.1)
Sodium: 137 mmol/L (ref 135–145)

## 2020-08-20 LAB — POC INFLUENZA A AND B ANTIGEN (URGENT CARE ONLY)
INFLUENZA A ANTIGEN, POC: NEGATIVE
INFLUENZA B ANTIGEN, POC: NEGATIVE

## 2020-08-20 MED ORDER — BENZONATATE 100 MG PO CAPS: 100.0000 mg | ORAL_CAPSULE | Freq: Three times a day (TID) | ORAL | 0 refills | Status: AC

## 2020-08-20 NOTE — Discharge Instructions (Addendum)
Your flu test was negative.  Your COVID test is pending.  We did draw your kidney function in case you are interested in antiviral medicine if you are positive for COVID.  Please let our nurse know if you are interested in starting this medication when they call you about your test results.  Use Tessalon for cough.  You can use over-the-counter medications including Tylenol and Mucinex for symptom relief.  Make sure you are drinking plenty of fluid.  If you have any worsening symptoms please return for reevaluation as we discussed.

## 2020-08-20 NOTE — ED Provider Notes (Signed)
[facility]    CSN: [id number] Arrival date & time: 08/20/20  1001      History   Chief Complaint Chief Complaint  Patient presents with   Nasal Congestion   Cough   Sore Throat    HPI Terri Mcconnell is a 75 y.o. female.   Patient presents today with a 2-day history of URI symptoms.  Reports sore throat, nasal congestion, sinus pressure, headache, fatigue, cough.  She denies any fever, chest pain, shortness of breath, body aches.  She has tried Coricidin without improvement of symptoms.  Reports household sick contacts with similar symptoms; grandchild with URI symptoms but was not tested for any viral infections.  Reports she is up-to-date on influenza and COVID-19 vaccinations including booster.  Denies any recent antibiotic use.  Denies history of allergies, asthma, COPD, smoking.  She is able to perform her daily activities despite symptoms.   Past Medical History:  Diagnosis Date   Diabetes mellitus without complication (HCC)    Hypertension     Patient Active Problem List   Diagnosis Date Noted   Left epiretinal membrane 12/09/2019   Non-insulin dependent type 2 diabetes mellitus (HCC) 12/09/2019   Nuclear sclerotic cataract of right eye 12/09/2019   Pseudophakia of left eye 12/09/2019   Vitreomacular adhesion of left eye 12/09/2019    Past Surgical History:  Procedure Laterality Date   CHOLECYSTECTOMY      OB History   No obstetric history on file.      Home Medications    Prior to Admission medications   Medication Sig Start Date End Date Taking? Authorizing Provider  benzonatate (TESSALON) 100 MG capsule Take 1 capsule (100 mg total) by mouth every 8 (eight) hours. 08/20/20  Yes Bevelyn Arriola K, PA-C  amLODipine (NORVASC) 10 MG tablet Take 10 mg by mouth daily. 07/08/20   [provider]  amLODipine-valsartan (EXFORGE) 5-160 MG tablet Take 1 tablet by mouth daily. 06/03/16   Jacalyn Lefevre, MD  cephALEXin (KEFLEX) 500 MG capsule Take  1 capsule (500 mg total) by mouth 4 (four) times daily. Patient not taking: Reported on 06/03/2016 01/09/13   Lorre Nick, MD  ferrous sulfate 325 (65 FE) MG tablet Take 325 mg by mouth daily with breakfast.    [provider]  HYDROcodone-acetaminophen (NORCO) 7.5-325 MG tablet Take 1 tablet by mouth every 6 (six) hours as needed. 05/27/20   [provider]  irbesartan-hydrochlorothiazide (AVALIDE) 300-12.5 MG tablet Take 1 tablet by mouth daily. 07/04/20   [provider]  meclizine (ANTIVERT) 25 MG tablet Take 1 tablet (25 mg total) by mouth 4 (four) times daily. Patient not taking: Reported on 06/03/2016 01/09/13   Lorre Nick, MD  metFORMIN (GLUCOPHAGE) 500 MG tablet Take 500 mg by mouth daily.    [provider]  metoprolol tartrate (LOPRESSOR) 25 MG tablet Take 0.5 tablets (12.5 mg total) by mouth 2 (two) times daily. 06/03/16   Jacalyn Lefevre, MD  potassium chloride SA (K-DUR,KLOR-CON) 20 MEQ tablet Take 20 mEq by mouth daily.    [provider]  rosuvastatin (CRESTOR) 10 MG tablet Take 10 mg by mouth daily.    [provider]  triamcinolone (KENALOG) 0.025 % ointment APPLY TO AFFECTED AREA TWICE A DAY 04/22/20   [provider]    Family History Family History  Problem Relation Age of Onset   Breast cancer Sister 2    Social History Social History   Tobacco Use   Smoking status: Never  Smokeless tobacco: Current    Types: Snuff  Substance Use Topics   Alcohol use: Yes   Drug use: No     Allergies   Patient has no known allergies.   Review of Systems Review of Systems  Constitutional:  Positive for activity change and fatigue. Negative for appetite change and fever.  HENT:  Positive for congestion and sore throat. Negative for sinus pressure and sneezing.   Respiratory:  Positive for cough. Negative for shortness of breath.   Cardiovascular:  Negative for chest pain.  Gastrointestinal:  Negative for  abdominal pain, diarrhea, nausea and vomiting.  Musculoskeletal:  Negative for arthralgias and myalgias.  Neurological:  Positive for headaches. Negative for dizziness and light-headedness.    Physical Exam Triage Vital Signs ED Triage Vitals  Enc Vitals Group     BP 08/20/20 1013 131/66     Pulse Rate 08/20/20 1013 90     Resp 08/20/20 1013 20     Temp 08/20/20 1013 98.4 F (36.9 C)     Temp Source 08/20/20 1013 Oral     SpO2 08/20/20 1013 100 %     Weight --      Height --      Head Circumference --      Peak Flow --      Pain Score 08/20/20 1014 0     Pain Loc --      Pain Edu? --      Excl. in GC? --    No data found.  Updated Vital Signs BP 131/66   Pulse 90   Temp 98.4 F (36.9 C) (Oral)   Resp 20   SpO2 100%   Visual Acuity Right Eye Distance:   Left Eye Distance:   Bilateral Distance:    Right Eye Near:   Left Eye Near:    Bilateral Near:     Physical Exam Vitals reviewed.  Constitutional:      General: She is awake. She is not in acute distress.    Appearance: Normal appearance. She is normal weight. She is not ill-appearing.     Comments: Very pleasant female appears stated age in no acute distress  HENT:     Head: Normocephalic and atraumatic.     Right Ear: Tympanic membrane, ear canal and external ear normal. Tympanic membrane is not erythematous or bulging.     Left Ear: Tympanic membrane, ear canal and external ear normal. Tympanic membrane is not erythematous or bulging.     Nose:     Right Sinus: No maxillary sinus tenderness or frontal sinus tenderness.     Left Sinus: No maxillary sinus tenderness or frontal sinus tenderness.     Mouth/Throat:     Pharynx: Uvula midline. Posterior oropharyngeal erythema present. No oropharyngeal exudate.     Comments: Moderate drainage posterior oropharynx Cardiovascular:     Rate and Rhythm: Normal rate and regular rhythm.     Heart sounds: Normal heart sounds, S1 normal and S2 normal. No murmur  heard. Pulmonary:     Effort: Pulmonary effort is normal.     Breath sounds: No wheezing, rhonchi or rales.     Comments: Clear to auscultation bilaterally Lymphadenopathy:     Head:     Right side of head: No submental, submandibular or tonsillar adenopathy.     Left side of head: No submental, submandibular or tonsillar adenopathy.     Cervical: No cervical adenopathy.  Psychiatric:        Behavior: Behavior  is cooperative.     UC Treatments / Results  Labs (all labs ordered are listed, but only abnormal results are displayed) Labs Reviewed  SARS CORONAVIRUS 2 (TAT 6-24 HRS)  BASIC METABOLIC PANEL  POC INFLUENZA A AND B ANTIGEN (URGENT CARE ONLY)    EKG   Radiology No results found.  Procedures Procedures (including critical care time)  Medications Ordered in UC Medications - No data to display  Initial Impression / Assessment and Plan / UC Course  I have reviewed the triage vital signs and the nursing notes.  Pertinent labs & imaging results that were available during my care of the patient were reviewed by me and considered in my medical decision making (see chart for details).      Flu testing was negative in clinic today.  COVID-19 test is pending.  Discussed potential utility of oral antivirals given patient's age and risk factors for moderate to severe disease.  She has not had a recent BMP so this was drawn today to have updated kidney function should she test positive for COVID-19 and be interested in Paxlovid.  Discussed likely viral etiology given short duration of symptoms.  Encourage patient to use over-the-counter medications for symptomatic care including Tylenol Mucinex.  She was prescribed Tessalon for cough.  Recommended she rest and drink plenty of fluid until symptoms resolve.  Follow-up discussed alarm symptoms that warrant emergent evaluation.  Strict return precautions given to which patient expressed understanding.  Final Clinical Impressions(s)  / UC Diagnoses   Final diagnoses:  Upper respiratory tract infection, unspecified type  Cough     Discharge Instructions      Your flu test was negative.  Your COVID test is pending.  We did draw your kidney function in case you are interested in antiviral medicine if you are positive for COVID.  Please let our nurse know if you are interested in starting this medication when they call you about your test results.  Use Tessalon for cough.  You can use over-the-counter medications including Tylenol and Mucinex for symptom relief.  Make sure you are drinking plenty of fluid.  If you have any worsening symptoms please return for reevaluation as we discussed.     ED Prescriptions     Medication Sig Dispense Auth. Provider   benzonatate (TESSALON) 100 MG capsule Take 1 capsule (100 mg total) by mouth every 8 (eight) hours. 21 capsule Ashby Leflore K, PA-C      PDMP not reviewed this encounter.   Jeani Hawking, PA-C 08/20/20 1141

## 2020-08-20 NOTE — ED Triage Notes (Signed)
Pt reports SX's started 2 days ago.

## 2020-08-21 LAB — SARS CORONAVIRUS 2 (TAT 6-24 HRS): SARS Coronavirus 2: NEGATIVE

## 2020-09-16 ENCOUNTER — Other Ambulatory Visit: Payer: Self-pay | Admitting: Family Medicine

## 2020-09-16 DIAGNOSIS — Z1231 Encounter for screening mammogram for malignant neoplasm of breast: Secondary | ICD-10-CM

## 2020-10-05 DIAGNOSIS — E1169 Type 2 diabetes mellitus with other specified complication: Secondary | ICD-10-CM | POA: Diagnosis not present

## 2020-10-05 DIAGNOSIS — I10 Essential (primary) hypertension: Secondary | ICD-10-CM | POA: Diagnosis not present

## 2020-10-05 DIAGNOSIS — E782 Mixed hyperlipidemia: Secondary | ICD-10-CM | POA: Diagnosis not present

## 2020-10-14 DIAGNOSIS — E782 Mixed hyperlipidemia: Secondary | ICD-10-CM | POA: Diagnosis not present

## 2020-10-14 DIAGNOSIS — I10 Essential (primary) hypertension: Secondary | ICD-10-CM | POA: Diagnosis not present

## 2020-10-14 DIAGNOSIS — E1169 Type 2 diabetes mellitus with other specified complication: Secondary | ICD-10-CM | POA: Diagnosis not present

## 2020-10-17 DIAGNOSIS — E6609 Other obesity due to excess calories: Secondary | ICD-10-CM | POA: Diagnosis not present

## 2020-10-17 DIAGNOSIS — E785 Hyperlipidemia, unspecified: Secondary | ICD-10-CM | POA: Diagnosis not present

## 2020-10-17 DIAGNOSIS — D649 Anemia, unspecified: Secondary | ICD-10-CM | POA: Diagnosis not present

## 2020-10-17 DIAGNOSIS — Z87891 Personal history of nicotine dependence: Secondary | ICD-10-CM | POA: Diagnosis not present

## 2020-10-17 DIAGNOSIS — E1169 Type 2 diabetes mellitus with other specified complication: Secondary | ICD-10-CM | POA: Diagnosis not present

## 2020-10-17 DIAGNOSIS — I10 Essential (primary) hypertension: Secondary | ICD-10-CM | POA: Diagnosis not present

## 2020-10-17 DIAGNOSIS — M172 Bilateral post-traumatic osteoarthritis of knee: Secondary | ICD-10-CM | POA: Diagnosis not present

## 2020-11-04 ENCOUNTER — Ambulatory Visit
Admission: RE | Admit: 2020-11-04 | Discharge: 2020-11-04 | Disposition: A | Discharge status: Home / Self Care | Payer: Medicare HMO | Source: Ambulatory Visit | Attending: Family Medicine | Admitting: Family Medicine

## 2020-11-04 ENCOUNTER — Other Ambulatory Visit: Payer: Self-pay

## 2020-11-04 DIAGNOSIS — Z1231 Encounter for screening mammogram for malignant neoplasm of breast: Secondary | ICD-10-CM

## 2020-12-05 DIAGNOSIS — E782 Mixed hyperlipidemia: Secondary | ICD-10-CM | POA: Diagnosis not present

## 2020-12-05 DIAGNOSIS — I10 Essential (primary) hypertension: Secondary | ICD-10-CM | POA: Diagnosis not present

## 2020-12-05 DIAGNOSIS — E1169 Type 2 diabetes mellitus with other specified complication: Secondary | ICD-10-CM | POA: Diagnosis not present

## 2020-12-08 ENCOUNTER — Ambulatory Visit (INDEPENDENT_AMBULATORY_CARE_PROVIDER_SITE_OTHER): Payer: Medicare HMO | Admitting: Ophthalmology

## 2020-12-08 ENCOUNTER — Encounter (INDEPENDENT_AMBULATORY_CARE_PROVIDER_SITE_OTHER): Payer: Self-pay | Admitting: Ophthalmology

## 2020-12-08 ENCOUNTER — Other Ambulatory Visit: Payer: Self-pay

## 2020-12-08 DIAGNOSIS — H35372 Puckering of macula, left eye: Secondary | ICD-10-CM | POA: Diagnosis not present

## 2020-12-08 DIAGNOSIS — H43822 Vitreomacular adhesion, left eye: Secondary | ICD-10-CM

## 2020-12-08 DIAGNOSIS — H2511 Age-related nuclear cataract, right eye: Secondary | ICD-10-CM

## 2020-12-08 DIAGNOSIS — E119 Type 2 diabetes mellitus without complications: Secondary | ICD-10-CM

## 2020-12-08 NOTE — Assessment & Plan Note (Signed)
No detectable diabetic retinopathy 

## 2020-12-08 NOTE — Assessment & Plan Note (Signed)
Likely needs to proceed with cataract surgery in the right eye to maximize and visual potential

## 2020-12-08 NOTE — Assessment & Plan Note (Signed)
Condition resolved foveal depression has returned improvement in acuity

## 2020-12-08 NOTE — Progress Notes (Signed)
12/08/2020     CHIEF COMPLAINT Patient presents for  Chief Complaint  Patient presents with   Retina Follow Up      HISTORY OF PRESENT ILLNESS: Terri Mcconnell is a 75 y.o. female who presents to the clinic today for:   HPI     Retina Follow Up   Patient presents with  Other.  In both eyes.  This started 1 week ago.  Duration of 1 week.  Since onset it is stable.      Last edited by Frederik Pear, COA on 12/08/2020  8:16 AM.      Referring physician: Olivia Canter, MD [address] [place],  [place] [address]  HISTORICAL INFORMATION:   Selected notes from the MEDICAL RECORD NUMBER    Lab Results  Component Value Date   HGBA1C 6.3 (H) 10/22/2010     CURRENT MEDICATIONS: No current outpatient medications on file. (Ophthalmic Drugs)   No current facility-administered medications for this visit. (Ophthalmic Drugs)   Current Outpatient Medications (Other)  Medication Sig   amLODipine (NORVASC) 10 MG tablet Take 10 mg by mouth daily.   amLODipine-valsartan (EXFORGE) 5-160 MG tablet Take 1 tablet by mouth daily.   benzonatate (TESSALON) 100 MG capsule Take 1 capsule (100 mg total) by mouth every 8 (eight) hours.   cephALEXin (KEFLEX) 500 MG capsule Take 1 capsule (500 mg total) by mouth 4 (four) times daily. (Patient not taking: Reported on 06/03/2016)   ferrous sulfate 325 (65 FE) MG tablet Take 325 mg by mouth daily with breakfast.   HYDROcodone-acetaminophen (NORCO) 7.5-325 MG tablet Take 1 tablet by mouth every 6 (six) hours as needed.   irbesartan-hydrochlorothiazide (AVALIDE) 300-12.5 MG tablet Take 1 tablet by mouth daily.   meclizine (ANTIVERT) 25 MG tablet Take 1 tablet (25 mg total) by mouth 4 (four) times daily. (Patient not taking: Reported on 06/03/2016)   metFORMIN (GLUCOPHAGE) 500 MG tablet Take 500 mg by mouth daily.   metoprolol tartrate (LOPRESSOR) 25 MG tablet Take 0.5 tablets (12.5 mg total) by mouth 2 (two) times daily.   potassium  chloride SA (K-DUR,KLOR-CON) 20 MEQ tablet Take 20 mEq by mouth daily.   rosuvastatin (CRESTOR) 10 MG tablet Take 10 mg by mouth daily.   triamcinolone (KENALOG) 0.025 % ointment APPLY TO AFFECTED AREA TWICE A DAY   No current facility-administered medications for this visit. (Other)      REVIEW OF SYSTEMS:    ALLERGIES No Known Allergies  PAST MEDICAL HISTORY Past Medical History:  Diagnosis Date   Diabetes mellitus without complication (HCC)    Hypertension    Past Surgical History:  Procedure Laterality Date   CHOLECYSTECTOMY      FAMILY HISTORY Family History  Problem Relation Age of Onset   Breast cancer Sister 53    SOCIAL HISTORY Social History   Tobacco Use   Smoking status: Never   Smokeless tobacco: Current    Types: Snuff  Substance Use Topics   Alcohol use: Yes   Drug use: No         OPHTHALMIC EXAM:  Base Eye Exam     Visual Acuity (ETDRS)       Right Left   Dist [place] 20/40 +1 20/32 -2   Dist ph [place] 20/32 20/32 +2         Tonometry (Tonopen, 8:21 AM)       Right Left   Pressure 18 15  Pupils       Pupils Dark Light Shape React APD   Right PERRL 4 3 Round Brisk None   Left PERRL 4 3 Round Brisk None         Visual Fields (Counting fingers)       Left Right    Full Full         Extraocular Movement       Right Left    Full, Ortho Full, Ortho         Neuro/Psych     Oriented x3: Yes   Mood/Affect: Normal         Dilation     Both eyes: 1.0% Mydriacyl, 2.5% Phenylephrine @ 8:21 AM           Slit Lamp and Fundus Exam     External Exam       Right Left   External Normal Normal         Slit Lamp Exam       Right Left   Lids/Lashes Normal Normal   Conjunctiva/Sclera White and quiet White and quiet   Cornea Clear Clear   Anterior Chamber Deep and quiet Deep and quiet   Iris Round and reactive Round and reactive   Lens 3+ Nuclear sclerosis Centered posterior chamber intraocular lens    Anterior Vitreous Normal Normal         Fundus Exam       Right Left   Posterior Vitreous Normal Clear vitrectomized   Disc Normal Normal   C/D Ratio 0.5 0.65   Macula Normal Normal   Vessels no DR no DR   Periphery Normal Normal            IMAGING AND PROCEDURES  Imaging and Procedures for 12/08/20  OCT, Retina - OU - Both Eyes       Right Eye Quality was good. Scan locations included subfoveal. Central Foveal Thickness: 235. Progression has been stable. Findings include normal observations, normal foveal contour.   Left Eye Quality was good. Central Foveal Thickness: 305. Progression has improved. Findings include abnormal foveal contour.   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  Vastly improved anatomy.  Stable acuity.  Observe             ASSESSMENT/PLAN:  Left epiretinal membrane Condition resolved foveal depression has returned improvement in acuity  Nuclear sclerotic cataract of right eye Likely needs to proceed with cataract surgery in the right eye to maximize and visual potential  Non-insulin dependent type 2 diabetes mellitus (HCC) No detectable diabetic retinopathy     ICD-10-CM   1. Left epiretinal membrane  H35.372 OCT, Retina - OU - Both Eyes    2. 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 Nuclear sclerotic cataract of right eye  H25.11     4. Non-insulin dependent type 2 diabetes mellitus (HCC)  E11.9       1.  OS looks great with improving macular anatomy and acuity post vitrectomy for severe ERM with VMT.  Now some 2.5 years postop.  2.  OD with cataract progression likely needs cataract surgery with lens placement under the direction of Dr. Fabian Sharp in order to maximize visual potential patient scheduled to see Dr. Dione Booze next  week  3.  Ophthalmic Meds Ordered this visit:  No orders of the defined types were placed in this encounter.      Return in about 1 year (around 12/08/2021) for DILATE OU, OCT.  There are no Patient Instructions on file for this visit.   Explained the diagnoses, plan, and follow up with the patient and they expressed understanding.  Patient expressed understanding of the importance of proper follow up care.   Alford Highland Manda Holstad M.D. Diseases & Surgery of the Retina and Vitreous [facility] 12/08/20     Abbreviations: M myopia (nearsighted); A astigmatism; H hyperopia (farsighted); P presbyopia; Mrx spectacle prescription;  CTL contact lenses; OD right eye; OS left eye; OU both eyes  XT exotropia; ET esotropia; PEK punctate epithelial keratitis; PEE punctate epithelial erosions; DES dry eye syndrome; MGD meibomian gland dysfunction; ATs artificial tears; PFAT's preservative free artificial tears; NSC nuclear sclerotic cataract; PSC posterior subcapsular cataract; ERM epi-retinal membrane; PVD posterior vitreous detachment; RD retinal detachment; DM diabetes mellitus; DR diabetic retinopathy; NPDR non-proliferative diabetic retinopathy; PDR proliferative diabetic retinopathy; CSME clinically significant macular edema; DME diabetic macular edema; dbh dot blot hemorrhages; CWS cotton wool spot; POAG primary open angle glaucoma; C/D cup-to-disc ratio; HVF humphrey visual field; GVF goldmann visual field; OCT optical coherence tomography; IOP intraocular pressure; BRVO Branch retinal vein occlusion; CRVO central retinal vein occlusion; CRAO central retinal artery occlusion; BRAO branch retinal artery occlusion; RT retinal tear; SB scleral buckle; PPV pars plana vitrectomy; VH Vitreous hemorrhage; PRP panretinal laser photocoagulation; IVK intravitreal kenalog; VMT vitreomacular traction; MH Macular hole;  NVD neovascularization of the disc; NVE neovascularization elsewhere; AREDS age  related eye disease study; ARMD age related macular degeneration; POAG primary open angle glaucoma; EBMD epithelial/anterior basement membrane dystrophy; ACIOL anterior chamber intraocular lens; IOL intraocular lens; PCIOL posterior chamber intraocular lens; Phaco/IOL phacoemulsification with intraocular lens placement; PRK photorefractive keratectomy; LASIK laser assisted in situ keratomileusis; HTN hypertension; DM diabetes mellitus; COPD chronic obstructive pulmonary disease

## 2020-12-15 DIAGNOSIS — H25811 Combined forms of age-related cataract, right eye: Secondary | ICD-10-CM | POA: Diagnosis not present

## 2020-12-15 DIAGNOSIS — E119 Type 2 diabetes mellitus without complications: Secondary | ICD-10-CM | POA: Diagnosis not present

## 2020-12-15 DIAGNOSIS — Z961 Presence of intraocular lens: Secondary | ICD-10-CM | POA: Diagnosis not present

## 2020-12-15 DIAGNOSIS — H26492 Other secondary cataract, left eye: Secondary | ICD-10-CM | POA: Diagnosis not present

## 2020-12-15 DIAGNOSIS — H35372 Puckering of macula, left eye: Secondary | ICD-10-CM | POA: Diagnosis not present

## 2021-02-10 DIAGNOSIS — I10 Essential (primary) hypertension: Secondary | ICD-10-CM | POA: Diagnosis not present

## 2021-02-10 DIAGNOSIS — E782 Mixed hyperlipidemia: Secondary | ICD-10-CM | POA: Diagnosis not present

## 2021-02-10 DIAGNOSIS — E1169 Type 2 diabetes mellitus with other specified complication: Secondary | ICD-10-CM | POA: Diagnosis not present

## 2021-02-10 DIAGNOSIS — E6609 Other obesity due to excess calories: Secondary | ICD-10-CM | POA: Diagnosis not present

## 2021-02-13 DIAGNOSIS — H35372 Puckering of macula, left eye: Secondary | ICD-10-CM | POA: Diagnosis not present

## 2021-02-13 DIAGNOSIS — E6609 Other obesity due to excess calories: Secondary | ICD-10-CM | POA: Diagnosis not present

## 2021-02-13 DIAGNOSIS — I10 Essential (primary) hypertension: Secondary | ICD-10-CM | POA: Diagnosis not present

## 2021-02-13 DIAGNOSIS — E782 Mixed hyperlipidemia: Secondary | ICD-10-CM | POA: Diagnosis not present

## 2021-02-13 DIAGNOSIS — E1169 Type 2 diabetes mellitus with other specified complication: Secondary | ICD-10-CM | POA: Diagnosis not present

## 2021-03-28 DIAGNOSIS — E1165 Type 2 diabetes mellitus with hyperglycemia: Secondary | ICD-10-CM | POA: Diagnosis not present

## 2021-03-28 DIAGNOSIS — E785 Hyperlipidemia, unspecified: Secondary | ICD-10-CM | POA: Diagnosis not present

## 2021-03-28 DIAGNOSIS — I10 Essential (primary) hypertension: Secondary | ICD-10-CM | POA: Diagnosis not present

## 2021-03-28 DIAGNOSIS — E1169 Type 2 diabetes mellitus with other specified complication: Secondary | ICD-10-CM | POA: Diagnosis not present

## 2021-03-28 DIAGNOSIS — E669 Obesity, unspecified: Secondary | ICD-10-CM | POA: Diagnosis not present

## 2021-06-12 DIAGNOSIS — E1169 Type 2 diabetes mellitus with other specified complication: Secondary | ICD-10-CM | POA: Diagnosis not present

## 2021-06-12 DIAGNOSIS — I10 Essential (primary) hypertension: Secondary | ICD-10-CM | POA: Diagnosis not present

## 2021-06-12 DIAGNOSIS — E782 Mixed hyperlipidemia: Secondary | ICD-10-CM | POA: Diagnosis not present

## 2021-06-12 DIAGNOSIS — M130 Polyarthritis, unspecified: Secondary | ICD-10-CM | POA: Diagnosis not present

## 2021-06-12 DIAGNOSIS — E6609 Other obesity due to excess calories: Secondary | ICD-10-CM | POA: Diagnosis not present

## 2021-06-13 DIAGNOSIS — M172 Bilateral post-traumatic osteoarthritis of knee: Secondary | ICD-10-CM | POA: Diagnosis not present

## 2021-06-13 DIAGNOSIS — N189 Chronic kidney disease, unspecified: Secondary | ICD-10-CM | POA: Diagnosis not present

## 2021-06-13 DIAGNOSIS — E1169 Type 2 diabetes mellitus with other specified complication: Secondary | ICD-10-CM | POA: Diagnosis not present

## 2021-06-13 DIAGNOSIS — Z0000 Encounter for general adult medical examination without abnormal findings: Secondary | ICD-10-CM | POA: Diagnosis not present

## 2021-06-13 DIAGNOSIS — I10 Essential (primary) hypertension: Secondary | ICD-10-CM | POA: Diagnosis not present

## 2021-06-13 DIAGNOSIS — D649 Anemia, unspecified: Secondary | ICD-10-CM | POA: Diagnosis not present

## 2021-06-13 DIAGNOSIS — E6609 Other obesity due to excess calories: Secondary | ICD-10-CM | POA: Diagnosis not present

## 2021-06-13 DIAGNOSIS — E782 Mixed hyperlipidemia: Secondary | ICD-10-CM | POA: Diagnosis not present

## 2021-08-04 DIAGNOSIS — E782 Mixed hyperlipidemia: Secondary | ICD-10-CM | POA: Diagnosis not present

## 2021-08-04 DIAGNOSIS — I10 Essential (primary) hypertension: Secondary | ICD-10-CM | POA: Diagnosis not present

## 2021-09-04 DIAGNOSIS — E782 Mixed hyperlipidemia: Secondary | ICD-10-CM | POA: Diagnosis not present

## 2021-09-04 DIAGNOSIS — I10 Essential (primary) hypertension: Secondary | ICD-10-CM | POA: Diagnosis not present

## 2021-09-04 DIAGNOSIS — E1169 Type 2 diabetes mellitus with other specified complication: Secondary | ICD-10-CM | POA: Diagnosis not present

## 2021-09-12 ENCOUNTER — Other Ambulatory Visit: Payer: Self-pay | Admitting: Family Medicine

## 2021-09-12 DIAGNOSIS — Z1231 Encounter for screening mammogram for malignant neoplasm of breast: Secondary | ICD-10-CM

## 2021-10-16 DIAGNOSIS — E782 Mixed hyperlipidemia: Secondary | ICD-10-CM | POA: Diagnosis not present

## 2021-10-16 DIAGNOSIS — I10 Essential (primary) hypertension: Secondary | ICD-10-CM | POA: Diagnosis not present

## 2021-10-16 DIAGNOSIS — E1169 Type 2 diabetes mellitus with other specified complication: Secondary | ICD-10-CM | POA: Diagnosis not present

## 2021-10-17 DIAGNOSIS — D649 Anemia, unspecified: Secondary | ICD-10-CM | POA: Diagnosis not present

## 2021-10-17 DIAGNOSIS — E785 Hyperlipidemia, unspecified: Secondary | ICD-10-CM | POA: Diagnosis not present

## 2021-10-17 DIAGNOSIS — E6609 Other obesity due to excess calories: Secondary | ICD-10-CM | POA: Diagnosis not present

## 2021-10-17 DIAGNOSIS — E1169 Type 2 diabetes mellitus with other specified complication: Secondary | ICD-10-CM | POA: Diagnosis not present

## 2021-10-17 DIAGNOSIS — Z6834 Body mass index (BMI) 34.0-34.9, adult: Secondary | ICD-10-CM | POA: Diagnosis not present

## 2021-10-17 DIAGNOSIS — M130 Polyarthritis, unspecified: Secondary | ICD-10-CM | POA: Diagnosis not present

## 2021-10-17 DIAGNOSIS — I10 Essential (primary) hypertension: Secondary | ICD-10-CM | POA: Diagnosis not present

## 2021-11-07 ENCOUNTER — Ambulatory Visit
Admission: RE | Admit: 2021-11-07 | Discharge: 2021-11-07 | Disposition: A | Discharge status: Home / Self Care | Payer: HMO | Source: Ambulatory Visit | Attending: Family Medicine | Admitting: Family Medicine

## 2021-11-07 DIAGNOSIS — Z1231 Encounter for screening mammogram for malignant neoplasm of breast: Secondary | ICD-10-CM | POA: Diagnosis not present

## 2021-12-11 ENCOUNTER — Encounter (INDEPENDENT_AMBULATORY_CARE_PROVIDER_SITE_OTHER): Payer: Medicare HMO | Admitting: Ophthalmology

## 2022-02-14 DIAGNOSIS — H2511 Age-related nuclear cataract, right eye: Secondary | ICD-10-CM | POA: Diagnosis not present

## 2022-02-16 DIAGNOSIS — H25811 Combined forms of age-related cataract, right eye: Secondary | ICD-10-CM | POA: Diagnosis not present

## 2022-03-28 DIAGNOSIS — E785 Hyperlipidemia, unspecified: Secondary | ICD-10-CM | POA: Diagnosis not present

## 2022-03-28 DIAGNOSIS — I10 Essential (primary) hypertension: Secondary | ICD-10-CM | POA: Diagnosis not present

## 2022-03-28 DIAGNOSIS — E1169 Type 2 diabetes mellitus with other specified complication: Secondary | ICD-10-CM | POA: Diagnosis not present

## 2022-03-28 DIAGNOSIS — E6609 Other obesity due to excess calories: Secondary | ICD-10-CM | POA: Diagnosis not present

## 2022-03-28 DIAGNOSIS — D649 Anemia, unspecified: Secondary | ICD-10-CM | POA: Diagnosis not present

## 2022-03-30 DIAGNOSIS — M172 Bilateral post-traumatic osteoarthritis of knee: Secondary | ICD-10-CM | POA: Diagnosis not present

## 2022-03-30 DIAGNOSIS — E1169 Type 2 diabetes mellitus with other specified complication: Secondary | ICD-10-CM | POA: Diagnosis not present

## 2022-03-30 DIAGNOSIS — D6489 Other specified anemias: Secondary | ICD-10-CM | POA: Diagnosis not present

## 2022-03-30 DIAGNOSIS — E785 Hyperlipidemia, unspecified: Secondary | ICD-10-CM | POA: Diagnosis not present

## 2022-03-30 DIAGNOSIS — I10 Essential (primary) hypertension: Secondary | ICD-10-CM | POA: Diagnosis not present

## 2022-04-05 DIAGNOSIS — M130 Polyarthritis, unspecified: Secondary | ICD-10-CM | POA: Diagnosis not present

## 2022-04-05 DIAGNOSIS — E782 Mixed hyperlipidemia: Secondary | ICD-10-CM | POA: Diagnosis not present

## 2022-04-05 DIAGNOSIS — I10 Essential (primary) hypertension: Secondary | ICD-10-CM | POA: Diagnosis not present

## 2022-04-05 DIAGNOSIS — E1169 Type 2 diabetes mellitus with other specified complication: Secondary | ICD-10-CM | POA: Diagnosis not present

## 2022-06-28 DIAGNOSIS — I10 Essential (primary) hypertension: Secondary | ICD-10-CM | POA: Diagnosis not present

## 2022-06-28 DIAGNOSIS — E1169 Type 2 diabetes mellitus with other specified complication: Secondary | ICD-10-CM | POA: Diagnosis not present

## 2022-06-28 DIAGNOSIS — E785 Hyperlipidemia, unspecified: Secondary | ICD-10-CM | POA: Diagnosis not present

## 2022-08-01 DIAGNOSIS — E785 Hyperlipidemia, unspecified: Secondary | ICD-10-CM | POA: Diagnosis not present

## 2022-08-01 DIAGNOSIS — G8929 Other chronic pain: Secondary | ICD-10-CM | POA: Diagnosis not present

## 2022-08-01 DIAGNOSIS — E876 Hypokalemia: Secondary | ICD-10-CM | POA: Diagnosis not present

## 2022-08-01 DIAGNOSIS — E669 Obesity, unspecified: Secondary | ICD-10-CM | POA: Diagnosis not present

## 2022-08-01 DIAGNOSIS — Z9849 Cataract extraction status, unspecified eye: Secondary | ICD-10-CM | POA: Diagnosis not present

## 2022-08-01 DIAGNOSIS — I10 Essential (primary) hypertension: Secondary | ICD-10-CM | POA: Diagnosis not present

## 2022-08-01 DIAGNOSIS — E1169 Type 2 diabetes mellitus with other specified complication: Secondary | ICD-10-CM | POA: Diagnosis not present

## 2022-09-05 DIAGNOSIS — I10 Essential (primary) hypertension: Secondary | ICD-10-CM | POA: Diagnosis not present

## 2022-09-05 DIAGNOSIS — E782 Mixed hyperlipidemia: Secondary | ICD-10-CM | POA: Diagnosis not present

## 2022-09-07 ENCOUNTER — Other Ambulatory Visit: Payer: Self-pay | Admitting: Family Medicine

## 2022-09-07 DIAGNOSIS — Z1231 Encounter for screening mammogram for malignant neoplasm of breast: Secondary | ICD-10-CM

## 2022-09-12 DIAGNOSIS — Z961 Presence of intraocular lens: Secondary | ICD-10-CM | POA: Diagnosis not present

## 2022-10-26 DIAGNOSIS — E559 Vitamin D deficiency, unspecified: Secondary | ICD-10-CM | POA: Diagnosis not present

## 2022-10-26 DIAGNOSIS — I10 Essential (primary) hypertension: Secondary | ICD-10-CM | POA: Diagnosis not present

## 2022-10-26 DIAGNOSIS — D6489 Other specified anemias: Secondary | ICD-10-CM | POA: Diagnosis not present

## 2022-10-26 DIAGNOSIS — E782 Mixed hyperlipidemia: Secondary | ICD-10-CM | POA: Diagnosis not present

## 2022-10-26 DIAGNOSIS — E1169 Type 2 diabetes mellitus with other specified complication: Secondary | ICD-10-CM | POA: Diagnosis not present

## 2022-10-29 DIAGNOSIS — E669 Obesity, unspecified: Secondary | ICD-10-CM | POA: Diagnosis not present

## 2022-10-29 DIAGNOSIS — Z23 Encounter for immunization: Secondary | ICD-10-CM | POA: Diagnosis not present

## 2022-10-29 DIAGNOSIS — E782 Mixed hyperlipidemia: Secondary | ICD-10-CM | POA: Diagnosis not present

## 2022-10-29 DIAGNOSIS — N189 Chronic kidney disease, unspecified: Secondary | ICD-10-CM | POA: Diagnosis not present

## 2022-10-29 DIAGNOSIS — E1169 Type 2 diabetes mellitus with other specified complication: Secondary | ICD-10-CM | POA: Diagnosis not present

## 2022-10-29 DIAGNOSIS — I10 Essential (primary) hypertension: Secondary | ICD-10-CM | POA: Diagnosis not present

## 2022-10-29 DIAGNOSIS — M130 Polyarthritis, unspecified: Secondary | ICD-10-CM | POA: Diagnosis not present

## 2022-11-09 ENCOUNTER — Ambulatory Visit: Payer: HMO

## 2022-11-14 DIAGNOSIS — E349 Endocrine disorder, unspecified: Secondary | ICD-10-CM | POA: Diagnosis not present

## 2022-11-14 DIAGNOSIS — N958 Other specified menopausal and perimenopausal disorders: Secondary | ICD-10-CM | POA: Diagnosis not present

## 2022-11-14 DIAGNOSIS — M069 Rheumatoid arthritis, unspecified: Secondary | ICD-10-CM | POA: Diagnosis not present

## 2022-11-14 DIAGNOSIS — E119 Type 2 diabetes mellitus without complications: Secondary | ICD-10-CM | POA: Diagnosis not present

## 2022-11-21 ENCOUNTER — Ambulatory Visit
Admission: RE | Admit: 2022-11-21 | Discharge: 2022-11-21 | Disposition: A | Discharge status: Home / Self Care | Payer: PPO | Source: Ambulatory Visit | Attending: Family Medicine | Admitting: Family Medicine

## 2022-11-21 DIAGNOSIS — Z1231 Encounter for screening mammogram for malignant neoplasm of breast: Secondary | ICD-10-CM

## 2022-12-31 DIAGNOSIS — M545 Low back pain, unspecified: Secondary | ICD-10-CM | POA: Diagnosis not present

## 2022-12-31 DIAGNOSIS — E1169 Type 2 diabetes mellitus with other specified complication: Secondary | ICD-10-CM | POA: Diagnosis not present

## 2022-12-31 DIAGNOSIS — I10 Essential (primary) hypertension: Secondary | ICD-10-CM | POA: Diagnosis not present

## 2022-12-31 DIAGNOSIS — E6609 Other obesity due to excess calories: Secondary | ICD-10-CM | POA: Diagnosis not present

## 2023-01-01 ENCOUNTER — Ambulatory Visit
Admission: RE | Admit: 2023-01-01 | Discharge: 2023-01-01 | Disposition: A | Discharge status: Home / Self Care | Payer: PPO | Source: Ambulatory Visit | Attending: Family Medicine | Admitting: Family Medicine

## 2023-01-01 ENCOUNTER — Other Ambulatory Visit: Payer: Self-pay | Admitting: Family Medicine

## 2023-01-01 DIAGNOSIS — M4698 Unspecified inflammatory spondylopathy, sacral and sacrococcygeal region: Secondary | ICD-10-CM | POA: Diagnosis not present

## 2023-01-01 DIAGNOSIS — M47817 Spondylosis without myelopathy or radiculopathy, lumbosacral region: Secondary | ICD-10-CM | POA: Diagnosis not present

## 2023-01-01 DIAGNOSIS — M545 Low back pain, unspecified: Secondary | ICD-10-CM

## 2023-01-21 DIAGNOSIS — I10 Essential (primary) hypertension: Secondary | ICD-10-CM | POA: Diagnosis not present

## 2023-01-21 DIAGNOSIS — E1169 Type 2 diabetes mellitus with other specified complication: Secondary | ICD-10-CM | POA: Diagnosis not present

## 2023-01-21 DIAGNOSIS — M4696 Unspecified inflammatory spondylopathy, lumbar region: Secondary | ICD-10-CM | POA: Diagnosis not present

## 2023-01-21 DIAGNOSIS — S72145A Nondisplaced intertrochanteric fracture of left femur, initial encounter for closed fracture: Secondary | ICD-10-CM | POA: Diagnosis not present

## 2023-01-21 DIAGNOSIS — E669 Obesity, unspecified: Secondary | ICD-10-CM | POA: Diagnosis not present

## 2023-01-22 ENCOUNTER — Encounter: Payer: Self-pay | Admitting: Family Medicine

## 2023-02-11 DIAGNOSIS — I10 Essential (primary) hypertension: Secondary | ICD-10-CM | POA: Diagnosis not present

## 2023-02-11 DIAGNOSIS — M4696 Unspecified inflammatory spondylopathy, lumbar region: Secondary | ICD-10-CM | POA: Diagnosis not present

## 2023-02-11 DIAGNOSIS — E1169 Type 2 diabetes mellitus with other specified complication: Secondary | ICD-10-CM | POA: Diagnosis not present

## 2023-02-11 DIAGNOSIS — S72145D Nondisplaced intertrochanteric fracture of left femur, subsequent encounter for closed fracture with routine healing: Secondary | ICD-10-CM | POA: Diagnosis not present

## 2023-02-11 DIAGNOSIS — M545 Low back pain, unspecified: Secondary | ICD-10-CM | POA: Diagnosis not present

## 2023-02-14 DIAGNOSIS — M5416 Radiculopathy, lumbar region: Secondary | ICD-10-CM | POA: Diagnosis not present

## 2023-02-18 DIAGNOSIS — M5116 Intervertebral disc disorders with radiculopathy, lumbar region: Secondary | ICD-10-CM | POA: Diagnosis not present

## 2023-02-18 DIAGNOSIS — M479 Spondylosis, unspecified: Secondary | ICD-10-CM | POA: Diagnosis not present

## 2023-02-25 DIAGNOSIS — M5116 Intervertebral disc disorders with radiculopathy, lumbar region: Secondary | ICD-10-CM | POA: Diagnosis not present

## 2023-02-25 DIAGNOSIS — M479 Spondylosis, unspecified: Secondary | ICD-10-CM | POA: Diagnosis not present

## 2023-03-04 DIAGNOSIS — M479 Spondylosis, unspecified: Secondary | ICD-10-CM | POA: Diagnosis not present

## 2023-03-04 DIAGNOSIS — M5116 Intervertebral disc disorders with radiculopathy, lumbar region: Secondary | ICD-10-CM | POA: Diagnosis not present

## 2023-03-11 DIAGNOSIS — M479 Spondylosis, unspecified: Secondary | ICD-10-CM | POA: Diagnosis not present

## 2023-03-11 DIAGNOSIS — M5116 Intervertebral disc disorders with radiculopathy, lumbar region: Secondary | ICD-10-CM | POA: Diagnosis not present

## 2023-04-05 DIAGNOSIS — I10 Essential (primary) hypertension: Secondary | ICD-10-CM | POA: Diagnosis not present

## 2023-04-05 DIAGNOSIS — D649 Anemia, unspecified: Secondary | ICD-10-CM | POA: Diagnosis not present

## 2023-04-05 DIAGNOSIS — E785 Hyperlipidemia, unspecified: Secondary | ICD-10-CM | POA: Diagnosis not present

## 2023-04-05 DIAGNOSIS — E1169 Type 2 diabetes mellitus with other specified complication: Secondary | ICD-10-CM | POA: Diagnosis not present

## 2023-04-05 DIAGNOSIS — E6609 Other obesity due to excess calories: Secondary | ICD-10-CM | POA: Diagnosis not present

## 2023-04-08 DIAGNOSIS — E669 Obesity, unspecified: Secondary | ICD-10-CM | POA: Diagnosis not present

## 2023-04-08 DIAGNOSIS — M545 Low back pain, unspecified: Secondary | ICD-10-CM | POA: Diagnosis not present

## 2023-04-08 DIAGNOSIS — I10 Essential (primary) hypertension: Secondary | ICD-10-CM | POA: Diagnosis not present

## 2023-04-08 DIAGNOSIS — D649 Anemia, unspecified: Secondary | ICD-10-CM | POA: Diagnosis not present

## 2023-04-19 DIAGNOSIS — M5416 Radiculopathy, lumbar region: Secondary | ICD-10-CM | POA: Diagnosis not present

## 2023-04-26 DIAGNOSIS — M545 Low back pain, unspecified: Secondary | ICD-10-CM | POA: Diagnosis not present

## 2023-05-03 DIAGNOSIS — M5416 Radiculopathy, lumbar region: Secondary | ICD-10-CM | POA: Diagnosis not present

## 2023-05-13 DIAGNOSIS — M5416 Radiculopathy, lumbar region: Secondary | ICD-10-CM | POA: Diagnosis not present

## 2023-06-07 DIAGNOSIS — M5416 Radiculopathy, lumbar region: Secondary | ICD-10-CM | POA: Diagnosis not present

## 2023-08-06 DIAGNOSIS — E6609 Other obesity due to excess calories: Secondary | ICD-10-CM | POA: Diagnosis not present

## 2023-08-06 DIAGNOSIS — I10 Essential (primary) hypertension: Secondary | ICD-10-CM | POA: Diagnosis not present

## 2023-08-06 DIAGNOSIS — E785 Hyperlipidemia, unspecified: Secondary | ICD-10-CM | POA: Diagnosis not present

## 2023-08-06 DIAGNOSIS — E1169 Type 2 diabetes mellitus with other specified complication: Secondary | ICD-10-CM | POA: Diagnosis not present

## 2023-08-08 DIAGNOSIS — L609 Nail disorder, unspecified: Secondary | ICD-10-CM | POA: Diagnosis not present

## 2023-08-08 DIAGNOSIS — E1169 Type 2 diabetes mellitus with other specified complication: Secondary | ICD-10-CM | POA: Diagnosis not present

## 2023-08-08 DIAGNOSIS — I10 Essential (primary) hypertension: Secondary | ICD-10-CM | POA: Diagnosis not present

## 2023-08-08 DIAGNOSIS — E782 Mixed hyperlipidemia: Secondary | ICD-10-CM | POA: Diagnosis not present

## 2023-08-08 DIAGNOSIS — D6489 Other specified anemias: Secondary | ICD-10-CM | POA: Diagnosis not present

## 2023-09-23 DIAGNOSIS — H35372 Puckering of macula, left eye: Secondary | ICD-10-CM | POA: Diagnosis not present

## 2023-09-23 DIAGNOSIS — E119 Type 2 diabetes mellitus without complications: Secondary | ICD-10-CM | POA: Diagnosis not present

## 2023-09-23 DIAGNOSIS — H26492 Other secondary cataract, left eye: Secondary | ICD-10-CM | POA: Diagnosis not present

## 2023-09-23 DIAGNOSIS — Z961 Presence of intraocular lens: Secondary | ICD-10-CM | POA: Diagnosis not present

## 2023-10-03 DIAGNOSIS — E1169 Type 2 diabetes mellitus with other specified complication: Secondary | ICD-10-CM | POA: Diagnosis not present

## 2023-10-03 DIAGNOSIS — D6489 Other specified anemias: Secondary | ICD-10-CM | POA: Diagnosis not present

## 2023-10-03 DIAGNOSIS — L609 Nail disorder, unspecified: Secondary | ICD-10-CM | POA: Diagnosis not present

## 2023-10-03 DIAGNOSIS — I10 Essential (primary) hypertension: Secondary | ICD-10-CM | POA: Diagnosis not present

## 2023-10-03 DIAGNOSIS — M545 Low back pain, unspecified: Secondary | ICD-10-CM | POA: Diagnosis not present

## 2023-10-03 DIAGNOSIS — E089 Diabetes mellitus due to underlying condition without complications: Secondary | ICD-10-CM | POA: Diagnosis not present

## 2023-10-17 ENCOUNTER — Other Ambulatory Visit: Payer: Self-pay | Admitting: Family Medicine

## 2023-10-17 DIAGNOSIS — Z1231 Encounter for screening mammogram for malignant neoplasm of breast: Secondary | ICD-10-CM

## 2023-10-28 DIAGNOSIS — M5416 Radiculopathy, lumbar region: Secondary | ICD-10-CM | POA: Diagnosis not present

## 2023-11-22 ENCOUNTER — Ambulatory Visit
Admission: RE | Admit: 2023-11-22 | Discharge: 2023-11-22 | Disposition: A | Source: Ambulatory Visit | Attending: Family Medicine | Admitting: Family Medicine

## 2023-11-22 DIAGNOSIS — Z1231 Encounter for screening mammogram for malignant neoplasm of breast: Secondary | ICD-10-CM | POA: Diagnosis not present

## 2023-12-03 DIAGNOSIS — Z860100 Personal history of colon polyps, unspecified: Secondary | ICD-10-CM | POA: Diagnosis not present

## 2023-12-18 DIAGNOSIS — K635 Polyp of colon: Secondary | ICD-10-CM | POA: Diagnosis not present

## 2023-12-18 DIAGNOSIS — Z860100 Personal history of colon polyps, unspecified: Secondary | ICD-10-CM | POA: Diagnosis not present

## 2023-12-18 DIAGNOSIS — Z1211 Encounter for screening for malignant neoplasm of colon: Secondary | ICD-10-CM | POA: Diagnosis not present

## 2023-12-18 DIAGNOSIS — E119 Type 2 diabetes mellitus without complications: Secondary | ICD-10-CM | POA: Diagnosis not present

## 2023-12-26 DIAGNOSIS — E669 Obesity, unspecified: Secondary | ICD-10-CM | POA: Diagnosis not present

## 2023-12-26 DIAGNOSIS — E118 Type 2 diabetes mellitus with unspecified complications: Secondary | ICD-10-CM | POA: Diagnosis not present

## 2023-12-26 DIAGNOSIS — E785 Hyperlipidemia, unspecified: Secondary | ICD-10-CM | POA: Diagnosis not present

## 2024-01-08 DIAGNOSIS — E6609 Other obesity due to excess calories: Secondary | ICD-10-CM | POA: Diagnosis not present

## 2024-01-08 DIAGNOSIS — Z9181 History of falling: Secondary | ICD-10-CM | POA: Diagnosis not present

## 2024-01-08 DIAGNOSIS — Z860100 Personal history of colon polyps, unspecified: Secondary | ICD-10-CM | POA: Diagnosis not present

## 2024-01-08 DIAGNOSIS — K5730 Diverticulosis of large intestine without perforation or abscess without bleeding: Secondary | ICD-10-CM | POA: Diagnosis not present

## 2024-01-08 DIAGNOSIS — Z6834 Body mass index (BMI) 34.0-34.9, adult: Secondary | ICD-10-CM | POA: Diagnosis not present

## 2024-01-08 DIAGNOSIS — Q438 Other specified congenital malformations of intestine: Secondary | ICD-10-CM | POA: Diagnosis not present

## 2024-01-08 DIAGNOSIS — K648 Other hemorrhoids: Secondary | ICD-10-CM | POA: Diagnosis not present

## 2024-01-08 DIAGNOSIS — R030 Elevated blood-pressure reading, without diagnosis of hypertension: Secondary | ICD-10-CM | POA: Diagnosis not present
